# Patient Record
Sex: Female | Born: 1935 | Race: Black or African American | Hispanic: No | Smoking: Never smoker
Health system: Southern US, Community
[De-identification: ages and names within clinical notes are randomized; demographics above are authoritative.]

## PROBLEM LIST (undated history)

## (undated) DIAGNOSIS — Z8673 Personal history of transient ischemic attack (TIA), and cerebral infarction without residual deficits: Secondary | ICD-10-CM

## (undated) DIAGNOSIS — F0390 Unspecified dementia without behavioral disturbance: Secondary | ICD-10-CM

## (undated) DIAGNOSIS — E079 Disorder of thyroid, unspecified: Secondary | ICD-10-CM

## (undated) DIAGNOSIS — E7800 Pure hypercholesterolemia, unspecified: Secondary | ICD-10-CM

## (undated) DIAGNOSIS — M1990 Unspecified osteoarthritis, unspecified site: Secondary | ICD-10-CM

## (undated) DIAGNOSIS — I10 Essential (primary) hypertension: Secondary | ICD-10-CM

## (undated) DIAGNOSIS — D649 Anemia, unspecified: Secondary | ICD-10-CM

## (undated) DIAGNOSIS — R61 Generalized hyperhidrosis: Secondary | ICD-10-CM

---

## 2000-03-14 ENCOUNTER — Ambulatory Visit (HOSPITAL_COMMUNITY): Admission: RE | Admit: 2000-03-14 | Discharge: 2000-03-14 | Payer: Self-pay | Admitting: Ophthalmology

## 2000-10-17 ENCOUNTER — Ambulatory Visit (HOSPITAL_COMMUNITY): Admission: RE | Admit: 2000-10-17 | Discharge: 2000-10-17 | Payer: Self-pay | Admitting: Family Medicine

## 2000-10-17 ENCOUNTER — Encounter: Payer: Self-pay | Admitting: Family Medicine

## 2000-11-18 ENCOUNTER — Ambulatory Visit (HOSPITAL_COMMUNITY): Admission: RE | Admit: 2000-11-18 | Discharge: 2000-11-18 | Payer: Self-pay | Admitting: Ophthalmology

## 2002-09-30 ENCOUNTER — Encounter: Payer: Self-pay | Admitting: Internal Medicine

## 2002-09-30 ENCOUNTER — Encounter: Admission: RE | Admit: 2002-09-30 | Discharge: 2002-09-30 | Payer: Self-pay | Admitting: Internal Medicine

## 2007-05-27 ENCOUNTER — Emergency Department (HOSPITAL_COMMUNITY): Admission: EM | Admit: 2007-05-27 | Discharge: 2007-05-27 | Payer: Self-pay | Admitting: Emergency Medicine

## 2007-09-09 ENCOUNTER — Emergency Department (HOSPITAL_COMMUNITY): Admission: EM | Admit: 2007-09-09 | Discharge: 2007-09-09 | Payer: Self-pay | Admitting: Emergency Medicine

## 2008-10-06 ENCOUNTER — Inpatient Hospital Stay (HOSPITAL_COMMUNITY): Admission: AD | Admit: 2008-10-06 | Discharge: 2008-10-09 | Payer: Self-pay | Admitting: Internal Medicine

## 2008-10-06 ENCOUNTER — Ambulatory Visit: Payer: Self-pay | Admitting: Cardiology

## 2008-10-08 ENCOUNTER — Ambulatory Visit: Payer: Self-pay | Admitting: Vascular Surgery

## 2008-10-08 ENCOUNTER — Encounter (INDEPENDENT_AMBULATORY_CARE_PROVIDER_SITE_OTHER): Payer: Self-pay | Admitting: *Deleted

## 2009-08-04 ENCOUNTER — Emergency Department (HOSPITAL_COMMUNITY): Admission: EM | Admit: 2009-08-04 | Discharge: 2009-08-04 | Payer: Self-pay | Admitting: Emergency Medicine

## 2009-10-03 ENCOUNTER — Observation Stay (HOSPITAL_COMMUNITY): Admission: EM | Admit: 2009-10-03 | Discharge: 2009-10-04 | Payer: Self-pay | Admitting: Emergency Medicine

## 2009-10-04 ENCOUNTER — Ambulatory Visit: Payer: Self-pay | Admitting: Vascular Surgery

## 2009-10-04 ENCOUNTER — Encounter (INDEPENDENT_AMBULATORY_CARE_PROVIDER_SITE_OTHER): Payer: Self-pay | Admitting: Internal Medicine

## 2010-11-13 ENCOUNTER — Encounter: Payer: Self-pay | Admitting: Family Medicine

## 2010-12-19 ENCOUNTER — Emergency Department (HOSPITAL_COMMUNITY): Payer: Medicare Other

## 2010-12-19 ENCOUNTER — Emergency Department (HOSPITAL_COMMUNITY)
Admission: EM | Admit: 2010-12-19 | Discharge: 2010-12-19 | Disposition: A | Discharge status: Home / Self Care | Payer: Medicare Other | Attending: Emergency Medicine | Admitting: Emergency Medicine

## 2010-12-19 DIAGNOSIS — J40 Bronchitis, not specified as acute or chronic: Secondary | ICD-10-CM | POA: Insufficient documentation

## 2010-12-19 DIAGNOSIS — Z8673 Personal history of transient ischemic attack (TIA), and cerebral infarction without residual deficits: Secondary | ICD-10-CM | POA: Insufficient documentation

## 2010-12-19 DIAGNOSIS — E7800 Pure hypercholesterolemia, unspecified: Secondary | ICD-10-CM | POA: Insufficient documentation

## 2010-12-19 DIAGNOSIS — J3489 Other specified disorders of nose and nasal sinuses: Secondary | ICD-10-CM | POA: Insufficient documentation

## 2010-12-19 DIAGNOSIS — E039 Hypothyroidism, unspecified: Secondary | ICD-10-CM | POA: Insufficient documentation

## 2010-12-19 DIAGNOSIS — I10 Essential (primary) hypertension: Secondary | ICD-10-CM | POA: Insufficient documentation

## 2010-12-19 DIAGNOSIS — R059 Cough, unspecified: Secondary | ICD-10-CM | POA: Insufficient documentation

## 2010-12-19 DIAGNOSIS — R6883 Chills (without fever): Secondary | ICD-10-CM | POA: Insufficient documentation

## 2010-12-19 DIAGNOSIS — R05 Cough: Secondary | ICD-10-CM | POA: Insufficient documentation

## 2010-12-19 LAB — CBC
HCT: 34.4 % — ABNORMAL LOW (ref 36.0–46.0)
MCHC: 34.3 g/dL (ref 30.0–36.0)
Platelets: 94 10*3/uL — ABNORMAL LOW (ref 150–400)
RDW: 13 % (ref 11.5–15.5)
WBC: 5.6 10*3/uL (ref 4.0–10.5)

## 2010-12-19 LAB — BASIC METABOLIC PANEL
Calcium: 9.2 mg/dL (ref 8.4–10.5)
GFR calc non Af Amer: 57 mL/min — ABNORMAL LOW (ref >60)
Glucose, Bld: 115 mg/dL — ABNORMAL HIGH (ref 70–99)
Potassium: 3.8 mEq/L (ref 3.5–5.1)
Sodium: 140 mEq/L (ref 135–145)

## 2011-01-23 LAB — CBC
HCT: 27.3 % — ABNORMAL LOW (ref 36.0–46.0)
HCT: 32.4 % — ABNORMAL LOW (ref 36.0–46.0)
Hemoglobin: 11.1 g/dL — ABNORMAL LOW (ref 12.0–15.0)
Platelets: 112 10*3/uL — ABNORMAL LOW (ref 150–400)
Platelets: 113 10*3/uL — ABNORMAL LOW (ref 150–400)
Platelets: 119 10*3/uL — ABNORMAL LOW (ref 150–400)
RBC: 3.34 MIL/uL — ABNORMAL LOW (ref 3.87–5.11)
RDW: 13.7 % (ref 11.5–15.5)
RDW: 14.6 % (ref 11.5–15.5)
WBC: 6.7 10*3/uL (ref 4.0–10.5)
WBC: 6.8 10*3/uL (ref 4.0–10.5)

## 2011-01-23 LAB — POCT CARDIAC MARKERS
CKMB, poc: <1 ng/mL — ABNORMAL LOW (ref 1.0–8.0)
Myoglobin, poc: 67.3 ng/mL (ref 12–200)
Troponin i, poc: <0.05 ng/mL (ref 0.00–0.09)

## 2011-01-23 LAB — PHOSPHORUS
Phosphorus: 3.4 mg/dL (ref 2.3–4.6)
Phosphorus: 4.5 mg/dL (ref 2.3–4.6)

## 2011-01-23 LAB — URINALYSIS, MICROSCOPIC ONLY
Hgb urine dipstick: NEGATIVE
Nitrite: NEGATIVE
Specific Gravity, Urine: 1.009 (ref 1.005–1.030)
Urobilinogen, UA: 0.2 mg/dL (ref 0.0–1.0)

## 2011-01-23 LAB — COMPREHENSIVE METABOLIC PANEL
ALT: 11 U/L (ref 0–35)
Albumin: 3.1 g/dL — ABNORMAL LOW (ref 3.5–5.2)
Albumin: 3.8 g/dL (ref 3.5–5.2)
Alkaline Phosphatase: 62 U/L (ref 39–117)
Alkaline Phosphatase: 71 U/L (ref 39–117)
BUN: 10 mg/dL (ref 6–23)
Chloride: 90 mEq/L — ABNORMAL LOW (ref 96–112)
Glucose, Bld: 108 mg/dL — ABNORMAL HIGH (ref 70–99)
Potassium: 3.9 mEq/L (ref 3.5–5.1)
Potassium: 4 mEq/L (ref 3.5–5.1)
Sodium: 126 mEq/L — ABNORMAL LOW (ref 135–145)
Sodium: 131 mEq/L — ABNORMAL LOW (ref 135–145)
Total Bilirubin: 0.6 mg/dL (ref 0.3–1.2)
Total Protein: 6.2 g/dL (ref 6.0–8.3)
Total Protein: 7.8 g/dL (ref 6.0–8.3)

## 2011-01-23 LAB — LIPID PANEL: HDL: 76 mg/dL (ref >39)

## 2011-01-23 LAB — CK TOTAL AND CKMB (NOT AT ARMC)
CK, MB: 1.1 ng/mL (ref 0.3–4.0)
Relative Index: INVALID (ref 0.0–2.5)
Total CK: 61 U/L (ref 7–177)

## 2011-01-23 LAB — FOLATE RBC: RBC Folate: 1314 ng/mL — ABNORMAL HIGH (ref 180–600)

## 2011-01-23 LAB — CALCIUM / CREATININE RATIO, URINE: Calcium, Ur: 1 mg/dL

## 2011-01-23 LAB — DIFFERENTIAL
Basophils Absolute: 0 10*3/uL (ref 0.0–0.1)
Basophils Relative: 0 % (ref 0–1)
Eosinophils Absolute: 0 10*3/uL (ref 0.0–0.7)
Monocytes Relative: 7 % (ref 3–12)
Neutrophils Relative %: 72 % (ref 43–77)

## 2011-01-23 LAB — SEDIMENTATION RATE: Sed Rate: 9 mm/hr (ref 0–22)

## 2011-01-23 LAB — BRAIN NATRIURETIC PEPTIDE: Pro B Natriuretic peptide (BNP): <30 pg/mL (ref 0.0–100.0)

## 2011-01-23 LAB — CARDIAC PANEL(CRET KIN+CKTOT+MB+TROPI)
CK, MB: 1 ng/mL (ref 0.3–4.0)
CK, MB: 1 ng/mL (ref 0.3–4.0)
Relative Index: INVALID (ref 0.0–2.5)
Total CK: 46 U/L (ref 7–177)
Troponin I: 0.03 ng/mL (ref 0.00–0.06)

## 2011-01-23 LAB — MAGNESIUM: Magnesium: 2 mg/dL (ref 1.5–2.5)

## 2011-01-23 LAB — OSMOLALITY: Osmolality: 274 mOsm/kg — ABNORMAL LOW (ref 275–300)

## 2011-01-23 LAB — NA AND K (SODIUM & POTASSIUM), RAND UR
Potassium Urine: 12 mEq/L
Sodium, Ur: 32 mEq/L

## 2011-01-23 LAB — IRON AND TIBC: Iron: 36 ug/dL — ABNORMAL LOW (ref 42–135)

## 2011-01-23 LAB — T4, FREE: Free T4: 1.11 ng/dL (ref 0.80–1.80)

## 2011-01-23 LAB — TROPONIN I: Troponin I: 0.02 ng/mL (ref 0.00–0.06)

## 2011-01-23 LAB — T3, FREE: T3, Free: 2.9 pg/mL (ref 2.3–4.2)

## 2011-01-23 LAB — URIC ACID: Uric Acid, Serum: 5.1 mg/dL (ref 2.4–7.0)

## 2011-01-23 LAB — URINE CULTURE

## 2011-01-23 LAB — CALCIUM: Calcium: 8.6 mg/dL (ref 8.4–10.5)

## 2011-01-25 LAB — COMPREHENSIVE METABOLIC PANEL
Alkaline Phosphatase: 102 U/L (ref 39–117)
BUN: 11 mg/dL (ref 6–23)
CO2: 25 mEq/L (ref 19–32)
Chloride: 103 mEq/L (ref 96–112)
GFR calc non Af Amer: >60 mL/min (ref >60)
Glucose, Bld: 108 mg/dL — ABNORMAL HIGH (ref 70–99)
Potassium: 3.9 mEq/L (ref 3.5–5.1)
Total Bilirubin: 0.5 mg/dL (ref 0.3–1.2)

## 2011-01-25 LAB — CBC
HCT: 34.6 % — ABNORMAL LOW (ref 36.0–46.0)
Hemoglobin: 11.6 g/dL — ABNORMAL LOW (ref 12.0–15.0)
RDW: 13.8 % (ref 11.5–15.5)

## 2011-01-25 LAB — PROTIME-INR
INR: 1.01 (ref 0.00–1.49)
Prothrombin Time: 13.2 seconds (ref 11.6–15.2)

## 2011-01-25 LAB — DIFFERENTIAL
Basophils Absolute: 0.1 10*3/uL (ref 0.0–0.1)
Basophils Relative: 1 % (ref 0–1)
Neutro Abs: 3.8 10*3/uL (ref 1.7–7.7)
Neutrophils Relative %: 60 % (ref 43–77)

## 2011-02-23 ENCOUNTER — Emergency Department (HOSPITAL_COMMUNITY): Payer: Medicare Other

## 2011-02-23 ENCOUNTER — Emergency Department (HOSPITAL_COMMUNITY)
Admission: EM | Admit: 2011-02-23 | Discharge: 2011-02-23 | Disposition: A | Discharge status: Home / Self Care | Payer: Medicare Other | Attending: Emergency Medicine | Admitting: Emergency Medicine

## 2011-02-23 DIAGNOSIS — R55 Syncope and collapse: Secondary | ICD-10-CM | POA: Insufficient documentation

## 2011-02-23 DIAGNOSIS — R5383 Other fatigue: Secondary | ICD-10-CM | POA: Insufficient documentation

## 2011-02-23 DIAGNOSIS — R5381 Other malaise: Secondary | ICD-10-CM | POA: Insufficient documentation

## 2011-02-23 DIAGNOSIS — R918 Other nonspecific abnormal finding of lung field: Secondary | ICD-10-CM | POA: Insufficient documentation

## 2011-02-23 DIAGNOSIS — E871 Hypo-osmolality and hyponatremia: Secondary | ICD-10-CM | POA: Insufficient documentation

## 2011-02-23 DIAGNOSIS — E860 Dehydration: Secondary | ICD-10-CM | POA: Insufficient documentation

## 2011-02-23 DIAGNOSIS — Z8673 Personal history of transient ischemic attack (TIA), and cerebral infarction without residual deficits: Secondary | ICD-10-CM | POA: Insufficient documentation

## 2011-02-23 DIAGNOSIS — E039 Hypothyroidism, unspecified: Secondary | ICD-10-CM | POA: Insufficient documentation

## 2011-02-23 DIAGNOSIS — I10 Essential (primary) hypertension: Secondary | ICD-10-CM | POA: Insufficient documentation

## 2011-02-23 DIAGNOSIS — R634 Abnormal weight loss: Secondary | ICD-10-CM | POA: Insufficient documentation

## 2011-02-23 DIAGNOSIS — R197 Diarrhea, unspecified: Secondary | ICD-10-CM | POA: Insufficient documentation

## 2011-02-23 DIAGNOSIS — G319 Degenerative disease of nervous system, unspecified: Secondary | ICD-10-CM | POA: Insufficient documentation

## 2011-02-23 DIAGNOSIS — E7800 Pure hypercholesterolemia, unspecified: Secondary | ICD-10-CM | POA: Insufficient documentation

## 2011-02-23 LAB — URINALYSIS, ROUTINE W REFLEX MICROSCOPIC
Bilirubin Urine: NEGATIVE
Glucose, UA: NEGATIVE mg/dL
Ketones, ur: NEGATIVE mg/dL
Nitrite: NEGATIVE
pH: 6 (ref 5.0–8.0)

## 2011-02-23 LAB — POCT CARDIAC MARKERS
CKMB, poc: <1 ng/mL — ABNORMAL LOW (ref 1.0–8.0)
Myoglobin, poc: 76.9 ng/mL (ref 12–200)
Troponin i, poc: <0.05 ng/mL (ref 0.00–0.09)
Troponin i, poc: <0.05 ng/mL (ref 0.00–0.09)

## 2011-02-23 LAB — CBC
MCH: 31.3 pg (ref 26.0–34.0)
Platelets: 118 10*3/uL — ABNORMAL LOW (ref 150–400)
RBC: 3.29 MIL/uL — ABNORMAL LOW (ref 3.87–5.11)

## 2011-02-23 LAB — DIFFERENTIAL
Basophils Absolute: 0 10*3/uL (ref 0.0–0.1)
Basophils Relative: 0 % (ref 0–1)
Eosinophils Absolute: 0.1 10*3/uL (ref 0.0–0.7)
Monocytes Relative: 10 % (ref 3–12)
Neutrophils Relative %: 55 % (ref 43–77)

## 2011-02-23 LAB — POCT I-STAT, CHEM 8
BUN: 21 mg/dL (ref 6–23)
Calcium, Ion: 1.12 mmol/L (ref 1.12–1.32)
Chloride: 96 mEq/L (ref 96–112)
Glucose, Bld: 139 mg/dL — ABNORMAL HIGH (ref 70–99)

## 2011-03-06 NOTE — Discharge Summary (Signed)
Summer [name], Summer [name]           ACCOUNT NO.:  [id number]   MEDICAL RECORD NO.:  [id number]          PATIENT TYPE:  INP   LOCATION:  1428                         FACILITY:  [facility]   PHYSICIAN:  Lucita Ferrara, MD         DATE OF BIRTH:  05-Dec-1918   DATE OF ADMISSION:  10/06/2008  DATE OF DISCHARGE:  10/07/2008                               DISCHARGE SUMMARY   DISCHARGE DIAGNOSES:  1. Transient ischemic attack.  2. Small acute nonhemorrhagic left-sided thalamic infarct.  3. Moderate-marked small vessel disease type changes.  4. Degenerative joint disease.  5. Cervical spondylitic changes and cord compression, most notable in      C4-C5 followed by C5-C6.  Within compression cord, there may be      gliosis and edema.   Dictation ends here.      Lucita Ferrara, MD  Electronically Signed     RR/MEDQ  D:  10/08/2008  T:  10/08/2008  Job:  [id number]

## 2011-03-06 NOTE — H&P (Signed)
Summer [name], Summer [name]           ACCOUNT NO.:  [id number]   MEDICAL RECORD NO.:  [id number]          PATIENT TYPE:  INP   LOCATION:  1428                         FACILITY:  [facility]   PHYSICIAN:  Della Goo, M.D. DATE OF BIRTH:  1919-07-23   DATE OF ADMISSION:  10/06/2008  DATE OF DISCHARGE:                              HISTORY & PHYSICAL   PRIMARY CARE PHYSICIAN:  Dr. Della Goo   CHIEF COMPLAINT:  Sudden numbness in jaw and left arm for 24 hours.   HISTORY OF PRESENT ILLNESS:  This is an 77 year old female who was seen  in my office for evaluation secondary to complaints of numbness in her  jaw and her left arm that she reports started 1 day ago.  She states  that it is a tingling sensation..  She denies having any trauma to the  area.  She denies having any chest pain, shortness of breath.  The  patient was seen in the office and found to have an elevated blood  pressure of 202/92.  She has had issues of elevated blood pressures in  the past and was placed on medication in the past as well and did not  tolerate medication with frequent episodes of hypotension.  The patient  currently denies having any headache, fevers, chills, congestion, chest  pain, shortness of breath, nausea, vomiting, diarrhea, changes in her  bowel or bladder habits.  She reports being able to ambulate without  difficulty.  Denies having any dizziness, lightheadedness or presyncope  symptoms.   PAST MEDICAL HISTORY:  Significant for labile blood pressures and  arthritis.   PAST SURGICAL HISTORY:  History of cataract surgeries of both eyes.   MEDICATIONS:  None other than over-the-counter medications.   ALLERGIES:  NO KNOWN DRUG ALLERGIES.   SOCIAL HISTORY:  The patient lives with her daughter.  She is a  nonsmoker, and she rarely has 1 glass of wine.   FAMILY HISTORY:  Positive for son and daughter with hypertension.  No  history of coronary artery disease, diabetes or cancer in her  family  that she knows of.   REVIEW OF SYSTEMS:  Pertinents are mentioned above.   PHYSICAL EXAMINATION FINDINGS:  GENERAL:  This is a thin 77 year old  female currently in discomfort but in no acute distress.  VITAL SIGNS:  Temperature 98.4, blood pressure 202/92, heart rate 76,  respirations 24.  HEENT:  Normocephalic, atraumatic.  Pupils equally round, reactive to  light.  There is no scleral icterus.  Extraocular movements are intact;  funduscopic benign.  Oropharynx is clear.  There was no facial drooping.  There is no tongue deviation.  NECK:  Supple with full range of motion.  No thyromegaly, adenopathy,  jugular venous distention.   CARDIOVASCULAR:  Regular rate and rhythm.  No murmurs, gallops or rubs.  LUNGS:  Clear to auscultation bilaterally.  ABDOMEN:  Positive bowel sounds, soft, nontender, nondistended.  EXTREMITIES:  Without cyanosis, clubbing or edema.  NEUROLOGIC EXAMINATION:  There are no focal deficits.  The patient is  alert and oriented x3.  Speech is clear.   ASSESSMENT:  The patient is an  77 year old female being referred for  direct admission for further evaluation of the following:  1. Left arm numbness.  2. Transient ischemic attack versus cerebrovascular accident.  3. Elevated blood pressure/uncontrolled hypertension.  4. Arthritis.   PLAN:  The patient will be admitted to telemetry area, and cardiac  enzymes will be started.  The patient will also be placed on neurologic  checks.  The patient will also be sent for an MRI study of the brain and  of the C-spine..  Clonidine has been ordered p.r.n. systolic blood  pressures greater than 160.  The patient will also be placed on DVT and  GI prophylaxis for now, and further therapy will ensue pending results  of her studies.      Della Goo, M.D.  Electronically Signed     HJ/MEDQ  D:  10/06/2008  T:  10/06/2008  Job:  [id number]   cc:   Della Goo, M.D.  Fax: [phone]

## 2011-03-06 NOTE — Discharge Summary (Signed)
NAMEMARCEE, [name]           ACCOUNT NO.:  [id number]   MEDICAL RECORD NO.:  [id number]          PATIENT TYPE:  INP   LOCATION:  1428                         [facility]  [facility]   PHYSICIAN:  Altha Harm, MDDATE OF BIRTH:  14-Aug-1919   DATE OF ADMISSION:  10/06/2008  DATE OF DISCHARGE:  10/09/2008                               DISCHARGE SUMMARY   DISPOSITION:  Home.   DISCHARGE DIAGNOSES:  1. Acute left thalamic cerebrovascular accident.  2. Labile hypertension, now controlled.  3. C4-C5 cord compression.  4. Hypothyroidism, new diagnosis.   DISCHARGE MEDICATIONS:  1. Enteric-coated aspirin 325 mg p.o. daily.  2. Zocor 20 mg p.o. daily.  3. Synthroid 25 mcg p.o. daily.  4. Prilosec 20 mg p.o. daily.   CONSULTATIONS:  Phone consultation with neurosurgery, Dr. Phoebe Perch.   PROCEDURES:  None.   DIAGNOSTIC STUDIES:  1. MRI of the brain without contrast that shows a small, acute, non-      hemorrhagic, left thalamic infarct.  Tiny areas of blood breakdown      products right parietal lobe, most suggestive of prior hemorrhagic      ischemia.  Otherwise no intracranial hemorrhage.  2. MRI of the C-spine without contrast which shows cervical      spondylytic changes and cord compression most notable at C4 to C5      level followed by C5 to C6 level.  Within the cord compression,      there may be gliosis or edema.  3. A 2-D echocardiogram done on December 18 which showed overall left      ventricular systolic function normal, with an ejection fraction of      55%. No regional left ventricular wall motion abnormalities,.  Left      ventricular wall thickness mildly decreased.  Aortic valve      thickness mildly increased.  Mild mitral valvular regurgitation.  4. Bilateral carotid duplex which shows no significant plaque      visualized.  No ICA stenosis.   CHIEF COMPLAINT:  Sudden numbness in the jaw and left arm for 24 hours.   HISTORY OF PRESENT ILLNESS:  Please refer  to the H&P dictated by Dr.  Della Goo for details of the HPI.   HOSPITAL COURSE:  The patient was admitted to the hospital with the  complaint of numbness.  Diagnostic studies were done in the form on an  MRI of the neck and head which showed findings as noted above.  The  patient received further testing for risk stratification including a 2-D  echocardiogram and carotid duplex with findings as noted above.  The  patient also had an RPR nonreactive check which was negative.  A TSH was  checked and was found to be elevated with a TSH level of 6.497.  Please  note that a T3 and free T4 level had been ordered and results are  pending.  I have asked the primary care physician, Dr. Della Goo,  to follow up on the results of this.  The patient also had a markedly  elevated blood pressure upon arrival to the hospital.  Apparently  this  patient has had labile blood pressures with periods of profound  hypotension.  Therefore, primary care physician has made the decision  not to put her on any chronic antihypertensive medication.  Throughout  her hospital stay, after the initial hypertensive readings, her blood  pressures have been controlled in the 130s.  The patient is being sent  home without any chronic blood pressure medications and I will defer to  her primary care physician in the office to make a decision about  chronic antihypertensive agents.  In terms of risk reduction, the  patient has been placed on an aspirin as well as statin for secondary  CVA risk reduction.  The patient is ambulatory in the hospital.  She has  not received a formal PT/OT evaluation.  However, the patient is  ambulatory without any difficulty and I will have physical therapy and  occupational therapy see the patient at home for home evaluation for any  modifications that may be required.  On CT scan of the neck the patient  was found to have spondylosis with some cervical cord compression from  C4  to C6.  This was discussed with the neurosurgeon who recommended that  the patient follow up as an outpatient as they did not feel surgical  intervention at this time was necessary.  The cord compression does not  appear to be causing any symptoms in this patient at this time.   CONDITION ON DISCHARGE:  Stable.  Her temperature is 98.8.  Heart rate  66, blood pressure 130/79, respiratory rate 20, oxygen saturation 100%  on room air.  Subsequent auscultation, no wheezing or rhonchi noted.  She has no murmurs, rubs or gallops noted on cardiovascular examination.  Abdominal examination showed abdomen soft, obese, nontender,  nondistended.  No hepatosplenomegaly.  There is no clubbing, cyanosis or  edema in the extremities.  The patient is tolerating her diet without  any difficulty.   FOLLOW UP:  The patient is to follow up with Dr. Della Goo in her  office in one week for further evaluation for blood pressure.  She needs  to have T3 and free T4 followed up by Dr. Lovell Sheehan also and a repeat of  her TSH done in approximately six weeks for the titration of her  medications.  In terms of the cord compression, the patient will follow  up with Dr. Leveda Anna in the office.  A phone number has been provided and  the patient is to call to make a followup appointment.   DIET:  The patient should be on a low sodium diet. Please note that the  patient's cholesterol was not checked and the patient should have a  fasting cholesterol profile done as an outpatient for further risk  reduction strategies.  I will defer that to her primary care physician  as cholesterol reduction will not make an immediate risk reduction in  this patient but will effect her over time; thus it is my recommendation  that at the earliest opportunity the patient's cholesterol be evaluated  and monitored as necessary.      Altha Harm, MD  Electronically Signed     MAM/MEDQ  D:  10/09/2008  T:  10/09/2008   Job:  [id number]   cc:   Della Goo, M.D.  Fax: [phone]   Santiago Bumpers. Leveda Anna, M.D.

## 2011-07-27 LAB — PROTIME-INR
INR: 1.1 (ref 0.00–1.49)
Prothrombin Time: 14.3 seconds (ref 11.6–15.2)

## 2011-07-27 LAB — COMPREHENSIVE METABOLIC PANEL
ALT: 10 U/L (ref 0–35)
Alkaline Phosphatase: 80 U/L (ref 39–117)
CO2: 28 mEq/L (ref 19–32)
Chloride: 103 mEq/L (ref 96–112)
GFR calc non Af Amer: >60 mL/min (ref >60)
Glucose, Bld: 102 mg/dL — ABNORMAL HIGH (ref 70–99)
Potassium: 3.6 mEq/L (ref 3.5–5.1)
Sodium: 137 mEq/L (ref 135–145)

## 2011-07-27 LAB — CARDIAC PANEL(CRET KIN+CKTOT+MB+TROPI)
CK, MB: 1.2 ng/mL (ref 0.3–4.0)
Relative Index: INVALID (ref 0.0–2.5)
Total CK: 58 U/L (ref 7–177)
Total CK: 59 U/L (ref 7–177)
Total CK: 65 U/L (ref 7–177)
Troponin I: <0.01 ng/mL (ref 0.00–0.06)

## 2011-07-27 LAB — CBC
Hemoglobin: 12.2 g/dL (ref 12.0–15.0)
RBC: 3.94 MIL/uL (ref 3.87–5.11)

## 2011-07-27 LAB — FOLATE RBC: RBC Folate: 829 ng/mL — ABNORMAL HIGH (ref 180–600)

## 2011-07-27 LAB — DIFFERENTIAL
Basophils Relative: 1 % (ref 0–1)
Eosinophils Absolute: 0.1 10*3/uL (ref 0.0–0.7)
Monocytes Relative: 10 % (ref 3–12)
Neutrophils Relative %: 44 % (ref 43–77)

## 2011-07-31 LAB — URINALYSIS, ROUTINE W REFLEX MICROSCOPIC
Glucose, UA: NEGATIVE
Ketones, ur: NEGATIVE
Specific Gravity, Urine: 1.015
pH: 7.5

## 2011-09-25 ENCOUNTER — Emergency Department (HOSPITAL_COMMUNITY): Payer: Medicare Other

## 2011-09-25 ENCOUNTER — Encounter (HOSPITAL_COMMUNITY)
Admission: EM | Disposition: A | Discharge status: Home Health | Payer: Self-pay | Source: Home / Self Care | Attending: Internal Medicine

## 2011-09-25 ENCOUNTER — Emergency Department (HOSPITAL_COMMUNITY): Payer: Medicare Other | Admitting: Anesthesiology

## 2011-09-25 ENCOUNTER — Inpatient Hospital Stay (HOSPITAL_COMMUNITY)
Admission: EM | Admit: 2011-09-25 | Discharge: 2011-10-03 | DRG: 470 | Disposition: A | Discharge status: Home Health | Payer: Medicare Other | Attending: Internal Medicine | Admitting: Internal Medicine

## 2011-09-25 ENCOUNTER — Encounter (HOSPITAL_COMMUNITY): Payer: Self-pay | Admitting: Anesthesiology

## 2011-09-25 ENCOUNTER — Encounter: Payer: Self-pay | Admitting: Emergency Medicine

## 2011-09-25 ENCOUNTER — Other Ambulatory Visit: Payer: Self-pay

## 2011-09-25 DIAGNOSIS — E7800 Pure hypercholesterolemia, unspecified: Secondary | ICD-10-CM | POA: Diagnosis present

## 2011-09-25 DIAGNOSIS — I10 Essential (primary) hypertension: Secondary | ICD-10-CM | POA: Diagnosis present

## 2011-09-25 DIAGNOSIS — S72001A Fracture of unspecified part of neck of right femur, initial encounter for closed fracture: Secondary | ICD-10-CM

## 2011-09-25 DIAGNOSIS — F0390 Unspecified dementia without behavioral disturbance: Secondary | ICD-10-CM | POA: Diagnosis present

## 2011-09-25 DIAGNOSIS — S72009A Fracture of unspecified part of neck of unspecified femur, initial encounter for closed fracture: Principal | ICD-10-CM | POA: Diagnosis present

## 2011-09-25 DIAGNOSIS — D62 Acute posthemorrhagic anemia: Secondary | ICD-10-CM | POA: Diagnosis not present

## 2011-09-25 DIAGNOSIS — Z8673 Personal history of transient ischemic attack (TIA), and cerebral infarction without residual deficits: Secondary | ICD-10-CM

## 2011-09-25 DIAGNOSIS — Y92009 Unspecified place in unspecified non-institutional (private) residence as the place of occurrence of the external cause: Secondary | ICD-10-CM

## 2011-09-25 DIAGNOSIS — W010XXA Fall on same level from slipping, tripping and stumbling without subsequent striking against object, initial encounter: Secondary | ICD-10-CM | POA: Diagnosis present

## 2011-09-25 HISTORY — DX: Personal history of transient ischemic attack (TIA), and cerebral infarction without residual deficits: Z86.73

## 2011-09-25 HISTORY — DX: Unspecified osteoarthritis, unspecified site: M19.90

## 2011-09-25 HISTORY — DX: Essential (primary) hypertension: I10

## 2011-09-25 HISTORY — DX: Pure hypercholesterolemia, unspecified: E78.00

## 2011-09-25 HISTORY — PX: HIP ARTHROPLASTY: SHX981

## 2011-09-25 HISTORY — DX: Disorder of thyroid, unspecified: E07.9

## 2011-09-25 LAB — URINALYSIS, ROUTINE W REFLEX MICROSCOPIC
Bilirubin Urine: NEGATIVE
Glucose, UA: NEGATIVE mg/dL
Hgb urine dipstick: NEGATIVE
Ketones, ur: NEGATIVE mg/dL
Protein, ur: NEGATIVE mg/dL
Urobilinogen, UA: 0.2 mg/dL (ref 0.0–1.0)

## 2011-09-25 LAB — BASIC METABOLIC PANEL
BUN: 9 mg/dL (ref 6–23)
Calcium: 9 mg/dL (ref 8.4–10.5)
GFR calc Af Amer: 72 mL/min — ABNORMAL LOW (ref >90)
GFR calc non Af Amer: 62 mL/min — ABNORMAL LOW (ref >90)
Potassium: 3.5 mEq/L (ref 3.5–5.1)

## 2011-09-25 LAB — CBC
Hemoglobin: 11.7 g/dL — ABNORMAL LOW (ref 12.0–15.0)
MCHC: 34.1 g/dL (ref 30.0–36.0)
Platelets: 99 10*3/uL — ABNORMAL LOW (ref 150–400)
RDW: 13.1 % (ref 11.5–15.5)

## 2011-09-25 LAB — PROTIME-INR
INR: 1.06 (ref 0.00–1.49)
Prothrombin Time: 14 seconds (ref 11.6–15.2)

## 2011-09-25 SURGERY — HEMIARTHROPLASTY, HIP, DIRECT ANTERIOR APPROACH, FOR FRACTURE
Anesthesia: General | Site: Hip | Laterality: Right | Wound class: Clean

## 2011-09-25 MED ORDER — GLYCOPYRROLATE 0.2 MG/ML IJ SOLN
INTRAMUSCULAR | Status: DC | PRN
  Administered 2011-09-25: .4 mg via INTRAVENOUS

## 2011-09-25 MED ORDER — LACTATED RINGERS IV SOLN
INTRAVENOUS | Status: AC
  Administered 2011-09-25: 1000 mL via INTRAVENOUS
  Administered 2011-09-25: 19:00:00 via INTRAVENOUS

## 2011-09-25 MED ORDER — CEFAZOLIN SODIUM 1-5 GM-% IV SOLN
INTRAVENOUS | Status: DC | PRN
  Administered 2011-09-25: 1 g via INTRAVENOUS

## 2011-09-25 MED ORDER — NEOSTIGMINE METHYLSULFATE 1 MG/ML IJ SOLN
INTRAMUSCULAR | Status: DC | PRN
  Administered 2011-09-25: 3 mg via INTRAVENOUS

## 2011-09-25 MED ORDER — LACTATED RINGERS IV SOLN
INTRAVENOUS | Status: DC
  Administered 2011-09-26: 1000 mL via INTRAVENOUS

## 2011-09-25 MED ORDER — METHOCARBAMOL 100 MG/ML IJ SOLN
500.0000 mg | Freq: Four times a day (QID) | INTRAVENOUS | Status: DC | PRN
  Filled 2011-09-25: qty 5

## 2011-09-25 MED ORDER — LIDOCAINE HCL (CARDIAC) 20 MG/ML IV SOLN
INTRAVENOUS | Status: DC | PRN
  Administered 2011-09-25: 20 mg via INTRAVENOUS

## 2011-09-25 MED ORDER — METOCLOPRAMIDE HCL 5 MG/ML IJ SOLN
10.0000 mg | Freq: Once | INTRAMUSCULAR | Status: AC | PRN
  Filled 2011-09-25: qty 2

## 2011-09-25 MED ORDER — METHOCARBAMOL 500 MG PO TABS
500.0000 mg | ORAL_TABLET | Freq: Four times a day (QID) | ORAL | Status: DC | PRN
  Administered 2011-09-26 – 2011-09-27 (×2): 500 mg via ORAL
  Filled 2011-09-25 (×2): qty 1

## 2011-09-25 MED ORDER — PROPOFOL 10 MG/ML IV EMUL
INTRAVENOUS | Status: DC | PRN
  Administered 2011-09-25: 100 mg via INTRAVENOUS

## 2011-09-25 MED ORDER — WARFARIN VIDEO
Freq: Once | Status: AC
  Administered 2011-09-26: 12:00:00

## 2011-09-25 MED ORDER — ONDANSETRON HCL 4 MG PO TABS: 4.0000 mg | ORAL_TABLET | Freq: Four times a day (QID) | ORAL | Status: DC | PRN

## 2011-09-25 MED ORDER — ACETAMINOPHEN 325 MG PO TABS: 650.0000 mg | ORAL_TABLET | Freq: Four times a day (QID) | ORAL | Status: DC | PRN

## 2011-09-25 MED ORDER — HYDROCODONE-ACETAMINOPHEN 5-325 MG PO TABS
1.0000 | ORAL_TABLET | ORAL | Status: DC | PRN
  Administered 2011-09-26: 2 via ORAL
  Administered 2011-09-26 – 2011-10-03 (×8): 1 via ORAL
  Filled 2011-09-25 (×5): qty 1
  Filled 2011-09-25: qty 2
  Filled 2011-09-25 (×3): qty 1

## 2011-09-25 MED ORDER — SUCCINYLCHOLINE CHLORIDE 20 MG/ML IJ SOLN
INTRAMUSCULAR | Status: DC | PRN
  Administered 2011-09-25: 80 mg via INTRAVENOUS

## 2011-09-25 MED ORDER — FENTANYL CITRATE 0.05 MG/ML IJ SOLN
50.0000 ug | Freq: Once | INTRAMUSCULAR | Status: AC
  Administered 2011-09-25: 50 ug via INTRAVENOUS
  Filled 2011-09-25: qty 2

## 2011-09-25 MED ORDER — SODIUM CHLORIDE 0.9 % IV SOLN: INTRAVENOUS | Status: AC

## 2011-09-25 MED ORDER — SODIUM CHLORIDE 0.9 % IR SOLN
Status: DC | PRN
  Administered 2011-09-25: 1000 mL

## 2011-09-25 MED ORDER — HYDROMORPHONE HCL PF 1 MG/ML IJ SOLN
0.5000 mg | INTRAMUSCULAR | Status: DC | PRN
  Administered 2011-09-25: 1 mg via INTRAVENOUS
  Administered 2011-09-26: 0.5 mg via INTRAVENOUS
  Administered 2011-09-26 (×3): 1 mg via INTRAVENOUS
  Administered 2011-09-27: 0.5 mg via INTRAVENOUS
  Administered 2011-09-27: 1 mg via INTRAVENOUS
  Administered 2011-09-27 – 2011-09-28 (×2): 0.5 mg via INTRAVENOUS
  Administered 2011-09-28: 1 mg via INTRAVENOUS
  Administered 2011-09-28: 0.5 mg via INTRAVENOUS
  Administered 2011-09-28 (×2): 1 mg via INTRAVENOUS
  Administered 2011-09-29: 0.5 mg via INTRAVENOUS
  Administered 2011-09-29: 1 mg via INTRAVENOUS
  Administered 2011-09-29: 0.5 mg via INTRAVENOUS
  Administered 2011-09-29 – 2011-09-30 (×4): 1 mg via INTRAVENOUS
  Administered 2011-09-30 – 2011-10-01 (×3): 0.5 mg via INTRAVENOUS
  Administered 2011-10-01 (×2): 1 mg via INTRAVENOUS
  Filled 2011-09-25 (×26): qty 1

## 2011-09-25 MED ORDER — WARFARIN SODIUM 4 MG PO TABS
4.0000 mg | ORAL_TABLET | Freq: Once | ORAL | Status: AC
  Administered 2011-09-26: 4 mg via ORAL
  Filled 2011-09-25 (×2): qty 1

## 2011-09-25 MED ORDER — FENTANYL CITRATE 0.05 MG/ML IJ SOLN
25.0000 ug | INTRAMUSCULAR | Status: DC | PRN
  Administered 2011-09-25 (×3): 25 ug via INTRAVENOUS

## 2011-09-25 MED ORDER — FENTANYL CITRATE 0.05 MG/ML IJ SOLN
50.0000 ug | INTRAMUSCULAR | Status: DC | PRN
  Administered 2011-09-25 (×2): 50 ug via INTRAVENOUS
  Filled 2011-09-25 (×2): qty 2

## 2011-09-25 MED ORDER — PATIENT'S GUIDE TO USING COUMADIN BOOK
Freq: Once | Status: AC
  Administered 2011-09-26: 1
  Filled 2011-09-25: qty 1

## 2011-09-25 MED ORDER — ONDANSETRON HCL 4 MG/2ML IJ SOLN
INTRAMUSCULAR | Status: DC | PRN
  Administered 2011-09-25 (×2): 2 mg via INTRAVENOUS

## 2011-09-25 MED ORDER — PHENOL 1.4 % MT LIQD
1.0000 | OROMUCOSAL | Status: DC | PRN
  Filled 2011-09-25: qty 177

## 2011-09-25 MED ORDER — CISATRACURIUM BESYLATE 2 MG/ML IV SOLN
INTRAVENOUS | Status: DC | PRN
  Administered 2011-09-25: 6 mg via INTRAVENOUS

## 2011-09-25 MED ORDER — CEFAZOLIN SODIUM 1-5 GM-% IV SOLN
1.0000 g | Freq: Three times a day (TID) | INTRAVENOUS | Status: AC
  Administered 2011-09-26 (×3): 1 g via INTRAVENOUS
  Filled 2011-09-25 (×4): qty 50

## 2011-09-25 MED ORDER — FENTANYL CITRATE 0.05 MG/ML IJ SOLN
INTRAMUSCULAR | Status: AC
  Filled 2011-09-25: qty 2

## 2011-09-25 MED ORDER — CEFAZOLIN SODIUM 1-5 GM-% IV SOLN
INTRAVENOUS | Status: AC
  Filled 2011-09-25: qty 50

## 2011-09-25 MED ORDER — ONDANSETRON HCL 4 MG/2ML IJ SOLN: 4.0000 mg | Freq: Four times a day (QID) | INTRAMUSCULAR | Status: DC | PRN

## 2011-09-25 MED ORDER — METOCLOPRAMIDE HCL 5 MG/ML IJ SOLN
5.0000 mg | Freq: Three times a day (TID) | INTRAMUSCULAR | Status: DC | PRN
  Filled 2011-09-25: qty 2

## 2011-09-25 MED ORDER — MENTHOL 3 MG MT LOZG
1.0000 | LOZENGE | OROMUCOSAL | Status: DC | PRN
  Filled 2011-09-25: qty 9

## 2011-09-25 MED ORDER — METOCLOPRAMIDE HCL 10 MG PO TABS: 5.0000 mg | ORAL_TABLET | Freq: Three times a day (TID) | ORAL | Status: DC | PRN

## 2011-09-25 MED ORDER — ENOXAPARIN SODIUM 30 MG/0.3ML ~~LOC~~ SOLN
30.0000 mg | Freq: Two times a day (BID) | SUBCUTANEOUS | Status: DC
  Administered 2011-09-26 – 2011-10-01 (×11): 30 mg via SUBCUTANEOUS
  Filled 2011-09-25 (×20): qty 0.3

## 2011-09-25 MED ORDER — SODIUM CHLORIDE 0.9 % IV BOLUS (SEPSIS)
500.0000 mL | INTRAVENOUS | Status: AC
  Administered 2011-09-25: 500 mL via INTRAVENOUS

## 2011-09-25 MED ORDER — FENTANYL CITRATE 0.05 MG/ML IJ SOLN
INTRAMUSCULAR | Status: DC | PRN
  Administered 2011-09-25 (×4): 50 ug via INTRAVENOUS

## 2011-09-25 MED ORDER — ACETAMINOPHEN 650 MG RE SUPP: 650.0000 mg | Freq: Four times a day (QID) | RECTAL | Status: DC | PRN

## 2011-09-25 SURGICAL SUPPLY — 46 items
BAG ZIPLOCK 12X15 (MISCELLANEOUS) ×2 IMPLANT
BLADE SAW SAG 73X25 THK (BLADE) ×1
BLADE SAW SGTL 73X25 THK (BLADE) ×1 IMPLANT
BRUSH FEMORAL CANAL (MISCELLANEOUS) ×2 IMPLANT
CANISTER SUCTION 2500CC (MISCELLANEOUS) ×4 IMPLANT
CEMENT BONE DEPUY (Cement) ×4 IMPLANT
CEMENT RESTRICTOR DEPUY SZ 3 (Cement) ×2 IMPLANT
CLOTH BEACON ORANGE TIMEOUT ST (SAFETY) ×2 IMPLANT
COVER SURGICAL LIGHT HANDLE (MISCELLANEOUS) ×2 IMPLANT
DRAPE INCISE IOBAN 66X45 STRL (DRAPES) ×2 IMPLANT
DRAPE ORTHO SPLIT 77X108 STRL (DRAPES) ×2
DRAPE POUCH INSTRU U-SHP 10X18 (DRAPES) ×2 IMPLANT
DRAPE SURG ORHT 6 SPLT 77X108 (DRAPES) ×2 IMPLANT
DRAPE U-SHAPE 47X51 STRL (DRAPES) ×2 IMPLANT
DRAPE WARM FLUID 44X44 (DRAPE) ×2 IMPLANT
DRSG EMULSION OIL 3X16 NADH (GAUZE/BANDAGES/DRESSINGS) ×2 IMPLANT
DRSG MEPILEX BORDER 4X8 (GAUZE/BANDAGES/DRESSINGS) ×2 IMPLANT
DRSG PAD ABDOMINAL 8X10 ST (GAUZE/BANDAGES/DRESSINGS) ×4 IMPLANT
ELECT REM PT RETURN 9FT ADLT (ELECTROSURGICAL) ×2
ELECTRODE REM PT RTRN 9FT ADLT (ELECTROSURGICAL) ×1 IMPLANT
EVACUATOR 1/8 PVC DRAIN (DRAIN) ×2 IMPLANT
FACESHIELD LNG OPTICON STERILE (SAFETY) ×6 IMPLANT
GLOVE BIO SURGEON STRL SZ8.5 (GLOVE) ×2 IMPLANT
HANDPIECE INTERPULSE COAX TIP (DISPOSABLE) ×1
HOOD PEEL AWAY FACE SHEILD DIS (HOOD) ×4 IMPLANT
IMMOBILIZER KNEE 20 (SOFTGOODS)
IMMOBILIZER KNEE 20 THIGH 36 (SOFTGOODS) IMPLANT
KIT BASIN OR (CUSTOM PROCEDURE TRAY) ×2 IMPLANT
NEEDLE MA TROC 1/2 (NEEDLE) ×2 IMPLANT
PACK TOTAL JOINT (CUSTOM PROCEDURE TRAY) ×2 IMPLANT
PASSER SUT SWANSON 36MM LOOP (INSTRUMENTS) ×2 IMPLANT
POSITIONER SURGICAL ARM (MISCELLANEOUS) ×2 IMPLANT
PRESSURIZER FEMORAL UNIV (MISCELLANEOUS) ×2 IMPLANT
SET HNDPC FAN SPRY TIP SCT (DISPOSABLE) ×1 IMPLANT
SPONGE GAUZE 4X4 12PLY (GAUZE/BANDAGES/DRESSINGS) ×4 IMPLANT
STAPLER VISISTAT 35W (STAPLE) ×2 IMPLANT
STRIP CLOSURE SKIN 1/2X4 (GAUZE/BANDAGES/DRESSINGS) ×4 IMPLANT
SUT ETHIBOND NAB CT1 #1 30IN (SUTURE) ×8 IMPLANT
SUT MNCRL AB 4-0 PS2 18 (SUTURE) ×2 IMPLANT
SUT VIC AB 1 CT1 27 (SUTURE) ×3
SUT VIC AB 1 CT1 27XBRD ANTBC (SUTURE) ×3 IMPLANT
SUT VIC AB 2-0 CT1 27 (SUTURE) ×3
SUT VIC AB 2-0 CT1 TAPERPNT 27 (SUTURE) ×3 IMPLANT
TOWEL OR 17X26 10 PK STRL BLUE (TOWEL DISPOSABLE) ×2 IMPLANT
TOWER CARTRIDGE SMART MIX (DISPOSABLE) ×2 IMPLANT
TRAY FOLEY CATH 14FRSI W/METER (CATHETERS) ×2 IMPLANT

## 2011-09-25 NOTE — ED Notes (Signed)
Per EMS pt fell getting out of bed to go to the restroom   Pt landed on her right hip  Pt has shortening and outward rotation  PMS intact  Right hip tender to palpation

## 2011-09-25 NOTE — Op Note (Signed)
NAMEADALIS, GATTI NO.:  [id number]  MEDICAL RECORD NO.:  [id number]  LOCATION:  WOTF                         FACILITY:  [facility]  PHYSICIAN:  Lubertha Basque. Deiondre Harrower, M.D.DATE OF BIRTH:  [birth year]  DATE OF PROCEDURE:  09/25/2011 DATE OF DISCHARGE:                              OPERATIVE REPORT   PREOPERATIVE DIAGNOSIS:  Right femoral neck fracture.  POSTOPERATIVE DIAGNOSIS:  Right femoral neck fracture.  PROCEDURE:  Right hip hemiarthroplasty.  ANESTHESIA:  General.  ATTENDING SURGEON:  Lubertha Basque. Jerl Santos, M.D.  ASSISTANT:  Lindwood Qua, PA  INDICATION FOR PROCEDURE:  The patient is a 77 year old relatively healthy woman, who fell at home earlier today and suffered displaced femoral neck fracture.  She is offered hemiarthroplasty in hopes of allowing her to sit and potentially stand and walk again.  Informed operative consent was obtained after discussion of possible complications including reaction to anesthesia, infection, DVT, dislocation, and death.  SUMMARY OF FINDINGS AND PROCEDURE:  Under general anesthesia and a standard posterior approach, a right hip hemiarthroplasty was performed. She had a displaced femoral neck fracture and fairly good bone quality. There were no degenerative changes.  We addressed her problem with cemented DePuy hemiarthroplasty using a size 3 Summit basic stem cemented in slight anteversion.  This was capped with a size 50+ 0 stainless steel unipolar  head assembly.  Lindwood Qua assisted throughout and was invaluable to the completion of the case, in that, he helped position and retract while I performed the procedure.  He also closed simultaneously to help minimize OR time.  DESCRIPTION OF PROCEDURE:  The patient was taken to the operating suite where general anesthetic was applied without difficulty.  She was positioned in the lateral decubitus position with the right hip up.  All bony prominences were padded,  axillary roll was placed, hip positioners were utilized.  She was then prepped and draped in normal sterile fashion.  After administration of IV Kefzol, a posterior approach was taken to the right hip.  All appropriate anti-infected measures were used including Betadine impregnated drape, and the preoperative IV antibiotic.  Dissection was carried down through some adipose tissue to the IT band and gluteus maximus fascia which were incised longitudinally to expose the short external rotators of the hip.  These structures were tagged and reflected.  A posterior capsulectomy was performed and the femoral head was removed.  This sized to a 50 and a trial head sizing was done which seemed adequate.  There were no degenerative changes in the acetabulum that I could appreciate.  A revision of femoral neck cut was made just above the lesser trochanter.  We then broached up to size 3 followed by trial reduction.  The +0 assembly seemed to give [place] best stability and leg length equality.  A cement spacer was placed distally followed by thorough irrigation of the canal.  This was dried thoroughly.  Cement was mixed and was pressurized into the femoral canal followed by placement of the aforementioned components in slight anteversion.  We did use a centralizer device on the tip of the component.  The pressure was held on the component until the cement had hardened and all excess cement was trimmed.  We then capped it with the aforementioned +0 size 50 stainless steel hip ball.  The hip was reduced and was stable in extension with external rotation as well as flexion with internal rotation.  Leg lengths were felt to be about equal.  The wound was irrigated followed by reapproximation of short external rotators to the greater trochanteric region with nonabsorbable suture. Again, the hip was irrigated followed by reapproximation of IT band and gluteus maximus fascia with #1 Vicryl in interrupted  fashion. Subcutaneous tissues were reapproximated with 0 and 2-0 undyed Vicryl and skin was closed with staples.  A Mepilex dressing was then applied. Estimated blood loss and intraoperative fluids were obtained from anesthesia records.  DISPOSITION:  The patient was extubated in operating room and taken to recovery room in stable addition.  She was to be admitted to the Orthopedic Surgery Service for appropriate postop care to include perioperative antibiotics and Coumadin plus Lovenox for DVT prophylaxis.     Lubertha Basque Jerl Santos, M.D.     PGD/MEDQ  D:  09/25/2011  T:  09/25/2011  Job:  [id number]

## 2011-09-25 NOTE — Progress Notes (Signed)
ANTICOAGULATION CONSULT NOTE - Initial Consult  Pharmacy Consult for Coumadin Indication: VTE prophylaxis  No Known Allergies  Patient Measurements: Height: 5\' 7"  (170.2 cm) Weight: 130 lb (58.968 kg) IBW/kg (Calculated) : 61.6    Vital Signs: Temp: 97.8 F (36.6 C) (12/04 2045) BP: 145/60 mmHg (12/04 2130) Pulse Rate: 75  (12/04 2130)  Labs:  [facility] 09/25/11 0911  HGB 11.7*  HCT 34.3*  PLT 99*  APTT 26  LABPROT 14.0  INR 1.06  HEPARINUNFRC --  CREATININE 0.81  CKTOTAL --  CKMB --  TROPONINI --   Estimated Creatinine Clearance: 43 ml/min (by C-G formula based on Cr of 0.81).  Medical History: Past Medical History  Diagnosis Date  . Hypertension   . Arthritis   . High cholesterol   . Thyroid disease   . Constipation   . Hx-TIA (transient ischemic attack)     Medications:  Scheduled:    . sodium chloride   Intravenous STAT  . ceFAZolin (ANCEF) IV  1 g Intravenous Q8H  . enoxaparin  30 mg Subcutaneous Q12H  . fentaNYL      . fentaNYL  50 mcg Intravenous Once  . sodium chloride  500 mL Intravenous STAT   Infusions:    . lactated ringers 100 mL/hr at 09/25/11 1853  . lactated ringers      Assessment: 77 yr old female s/p hip fracture due to fall.  Pt underwent right hip hemiarthroplasty.  Coumadin to begin for VTE prophylaxis.  Pt on Lovenox 30mg  sq q12h until INR > 2.  Goal of Therapy:  INR 2-3   Plan:  1.  Coumadin 4mg  po x 1 tonight 2.  Coumadin book/video 3.  Check daily PT/INR  Maryellen Pile 09/25/2011,10:06 PM

## 2011-09-25 NOTE — ED Provider Notes (Signed)
History     CSN: [id number] Arrival date & time: 09/25/2011  5:27 AM   First MD Initiated Contact with Patient 09/25/11 0730      Chief Complaint  Patient presents with  . Fall  . Hip Pain   HPI Patient presents to emergency room with complaints of fall. Patient has right hip pain. Patient's landed on her right head. Denies hitting her head or loss of consciousness. Reports that she was getting out of bed and slipped and fell. Denies any chest pain, SOB, or other problems prior to the event. Patient denies any nausea or vomiting. Denies any weakness prior or numbness prior to the event. Reports that the fall was mechanical, in that she slipped.   Past Medical History  Diagnosis Date  . Hypertension   . Arthritis   . High cholesterol   . Thyroid disease   . TIA (transient ischemic attack)   . Constipation     History reviewed. No pertinent past surgical history.  No family history on file.  History  Substance Use Topics  . Smoking status: Never Smoker   . Smokeless tobacco: Not on file  . Alcohol Use: No    OB History    Grav Para Term Preterm Abortions TAB SAB Ect Mult Living                  Review of Systems  Constitutional: Negative for fever, chills, diaphoresis and appetite change.  HENT: Negative for neck pain.   Eyes: Negative for photophobia and visual disturbance.  Respiratory: Negative for cough, chest tightness and shortness of breath.   Cardiovascular: Negative for chest pain.  Gastrointestinal: Negative for nausea, vomiting and abdominal pain.  Genitourinary: Negative for flank pain.  Musculoskeletal: Negative for back pain.  Skin: Negative for rash and wound.  Neurological: Positive for weakness. Negative for numbness.  All other systems reviewed and are negative.    Allergies  Review of patient's allergies indicates no known allergies.  Home Medications   Current Outpatient Rx  Name Route Sig Dispense Refill  . ALENDRONATE SODIUM 10 MG PO  TABS Oral Take 70 mg by mouth once a week. Take with a full glass of water on an empty stomach.     . ASPIRIN 81 MG PO TABS Oral Take 81 mg by mouth daily.      Marland Kitchen FEXOFENADINE HCL 180 MG PO TABS Oral Take 180 mg by mouth daily.      Marland Kitchen LEVOTHYROXINE SODIUM 88 MCG PO TABS Oral Take 88 mcg by mouth daily.      . MULTIVITAMINS PO CAPS Oral Take 1 capsule by mouth daily.      Marland Kitchen OMEPRAZOLE 20 MG PO CPDR Oral Take 20 mg by mouth 2 (two) times daily.     Marland Kitchen SIMVASTATIN 20 MG PO TABS Oral Take 20 mg by mouth at bedtime.      . TRAMADOL HCL 50 MG PO TABS Oral Take 50 mg by mouth every 6 (six) hours as needed. Maximum dose= 8 tablets per day     . VALSARTAN 320 MG PO TABS Oral Take 320 mg by mouth daily.        BP 189/84  Pulse 64  Temp(Src) 98.7 F (37.1 C) (Oral)  Resp 18  SpO2 97%  Physical Exam  Nursing note and vitals reviewed. Constitutional: She appears well-developed and well-nourished. No distress.  HENT:  Head: Normocephalic and atraumatic.  Eyes: EOM are normal. Pupils are equal, round, and reactive  to light.  Neck: Normal range of motion. Neck supple.  Cardiovascular: Normal rate and regular rhythm.   Pulmonary/Chest: Effort normal and breath sounds normal.  Abdominal: Bowel sounds are normal. She exhibits no distension. There is no tenderness.  Musculoskeletal: She exhibits tenderness.       Right hip: She exhibits tenderness, bony tenderness and deformity. She exhibits no laceration.       Legs:      Dorsalis pedis and tibialis pulse intact. Full sensation. Leg shortening on the right and internal rotation to the right leg.   Neurological: She is alert.       Sensation normal  Skin: Skin is warm and dry. She is not diaphoretic.  Psychiatric: She has a normal mood and affect. Her behavior is normal.    ED Course  Procedures (including critical care time)  Patient seen and evaluated.  VSS reviewed. . Nursing notes reviewed. Discussed with attending physician, Dr. Effie Shy.  Initial testing ordered. Will monitor the patient closely. They agree with the treatment plan and diagnosis.  Dg Hip Complete Right  09/25/2011  *RADIOLOGY REPORT*  Clinical Data: Status post fall; right hip pain.  RIGHT HIP - COMPLETE 2+ VIEW  Comparison: None.  Findings: There is a slightly comminuted subcapital fracture through the right femoral neck, with superior displacement of the distal femur.  The right femoral head remains seated at the acetabulum.  There is mild axial joint space narrowing, with associated sclerotic change.  More minimal degenerative change is noted at the left hip.  Mild sclerosis is noted at the sacroiliac joints.  Mild degenerative change is noted at the lower lumbar spine.  The visualized bowel gas pattern is grossly unremarkable in appearance.  IMPRESSION: Slightly comminuted subcapital fracture through the right femoral neck, with superior displacement of the distal femur.  Mild degenerative change noted at the right hip joint.  Original Report Authenticated By: Tonia Ghent, M.D.   Dg Chest Port 1 View  09/25/2011  *RADIOLOGY REPORT*  Clinical Data: Fall, right hip pain  PORTABLE CHEST - 1 VIEW  Comparison: 02/23/2011  Findings: Cardiomediastinal silhouette is stable.  No acute infiltrate or pleural effusion.  No pulmonary edema.  IMPRESSION: No active disease.  Original Report Authenticated By: Natasha Mead, M.D.   7:49 AM Dr. Luiz Blare to see the patient. Hosptialist to admit.   8:20 AM Spoke to Dr. Jarold Motto who will ask Dr. Felipa Eth to admit the patient.    MDM  Right femoral neck fracture        Demetrius Charity, PA 09/25/11 [phone]

## 2011-09-25 NOTE — ED Notes (Signed)
Pt's daughter reports that pt is incoherent and feeling sob. Boneta Lucks, PA notified and will come and evaluate pt. NAD noted at this time.

## 2011-09-25 NOTE — Brief Op Note (Signed)
Summer [name] [id number] 09/25/2011   PRE-OP DIAGNOSIS: right hip femoral neck fracture  POST-OP DIAGNOSIS: same  PROCEDURE: right hip hemiarthroplasty  ANESTHESIA: general  Summer [name]   Dictation #:  [id number]

## 2011-09-25 NOTE — ED Notes (Signed)
Pt awoke this morning with an intention of going to the bathroom.  Pt was closer to the edge of her bed than she thought.  Pt rolled out of her bed and fell to her floor landing on her right hip and arm.  Pt states that her right hip has pain rated @ 10/10 upon movement.  Pt denies pain elsewhere and denies striking her head or LOC.

## 2011-09-25 NOTE — Transfer of Care (Signed)
Immediate Anesthesia Transfer of Care Note  Patient: Summer [name]  Procedure(s) Performed:  ARTHROPLASTY BIPOLAR HIP - Cemented vs Right Hemi Arthroplasty, Right Hip  Patient Location: PACU  Anesthesia Type: General  Level of Consciousness: awake and alert   Airway & Oxygen Therapy: Patient Spontanous Breathing and Patient connected to face mask oxygen  Post-op Assessment: Report given to PACU RN and Post -op Vital signs reviewed and stable  Post vital signs: Reviewed and stable  Complications: No apparent anesthesia complications

## 2011-09-25 NOTE — ED Provider Notes (Signed)
Medical screening examination/treatment/procedure(s) were performed by non-physician practitioner and as supervising physician I was immediately available for consultation/collaboration.  Flint Melter, MD 09/25/11 2103

## 2011-09-25 NOTE — ED Notes (Signed)
Boneta Lucks, PA at bedside to speak with pt and family.

## 2011-09-25 NOTE — Anesthesia Preprocedure Evaluation (Addendum)
Anesthesia Evaluation  Patient identified by MRN, date of birth, ID band Patient awake    Reviewed: Allergy & Precautions, H&P , NPO status , Patient's Chart, lab work & pertinent test results  Airway Mallampati: II TM Distance: >3 FB Neck ROM: Full    Dental No notable dental hx. (+) Edentulous Upper and Edentulous Lower   Pulmonary neg pulmonary ROS,  clear to auscultation  Pulmonary exam normal       Cardiovascular hypertension, neg cardio ROS Regular Normal    Neuro/Psych TIANegative Neurological ROS  Negative Psych ROS   GI/Hepatic negative GI ROS, Neg liver ROS,   Endo/Other  Negative Endocrine ROS  Renal/GU negative Renal ROS  Genitourinary negative   Musculoskeletal negative musculoskeletal ROS (+)   Abdominal   Peds negative pediatric ROS (+)  Hematology negative hematology ROS (+)   Anesthesia Other Findings   Reproductive/Obstetrics negative OB ROS                          Anesthesia Physical Anesthesia Plan  ASA: III  Anesthesia Plan: General   Post-op Pain Management:    Induction: Intravenous  Airway Management Planned: Oral ETT  Additional Equipment:   Intra-op Plan:   Post-operative Plan: Extubation in OR  Informed Consent: I have reviewed the patients History and Physical, chart, labs and discussed the procedure including the risks, benefits and alternatives for the proposed anesthesia with the patient or authorized representative who has indicated his/her understanding and acceptance.   Dental advisory given  Plan Discussed with: CRNA  Anesthesia Plan Comments:         Anesthesia Quick Evaluation

## 2011-09-25 NOTE — H&P (Signed)
Subjective: Patient is a 77 y.o. female who presents with history of a fall at home resulting in inability to walk on the right. This occurred today.  There are no active problems to display for this patient.  Past Medical History  Diagnosis Date  . Hypertension   . Arthritis   . High cholesterol   . Thyroid disease   . TIA (transient ischemic attack)   . Constipation     History reviewed. No pertinent past surgical history.   (Not in a hospital admission) No Known Allergies  History  Substance Use Topics  . Smoking status: Never Smoker   . Smokeless tobacco: Not on file  . Alcohol Use: No    No family history on file.  Review of Systems Pertinent items are noted in HPI.  Objective: Vital signs in last 24 hours: Temp:  [98.7 F (37.1 C)-98.9 F (37.2 C)] 98.9 F (37.2 C) (12/04 0757) Pulse Rate:  [61-64] 61  (12/04 0757) Resp:  [15-25] 16  (12/04 1106) BP: (151-189)/(62-84) 156/67 mmHg (12/04 1106) SpO2:  [97 %-98 %] 98 % (12/04 1106)  BP 156/67  Pulse 61  Temp(Src) 98.9 F (37.2 C) (Oral)  Resp 16  SpO2 98%  General Appearance:    Alert, cooperative, no distress, appears stated age  Head:    Normocephalic, without obvious abnormality, atraumatic  Eyes:    PERRL, conjunctiva/corneas clear, EOM's intact, fundi    benign, both eyes  Ears:    Normal TM's and external ear canals, both ears  Nose:   Nares normal, septum midline, mucosa normal, no drainage    or sinus tenderness  Throat:   Lips, mucosa, and tongue normal; teeth and gums normal  Neck:   Supple, symmetrical, trachea midline, no adenopathy;    thyroid:  no enlargement/tenderness/nodules; no carotid   bruit or JVD  Back:     Symmetric, no curvature, ROM normal, no CVA tenderness  Lungs:     Clear to auscultation bilaterally, respirations unlabored  Chest Wall:    No tenderness or deformity   Heart:    Regular rate and rhythm, S1 and S2 normal, no murmur, rub   or gallop  Breast Exam:    No  tenderness, masses, or nipple abnormality  Abdomen:     Soft, non-tender, bowel sounds active all four quadrants,    no masses, no organomegaly  Genitalia:    Normal female without lesion, discharge or tenderness  Rectal:    Normal tone, normal prostate, no masses or tenderness;   guaiac negative stool  Extremities:   Extremities normal, atraumatic, no cyanosis or edema Right leg is SER and painful to ROM.  ;Right UE painful too but not much different from baseline per daughter  Pulses:   2+ and symmetric all extremities  Skin:   Skin color, texture, turgor normal, no rashes or lesions  Lymph nodes:   Cervical, supraclavicular, and axillary nodes normal  Neurologic:   CNII-XII intact, normal strength, sensation and reflexes    throughout    Imaging Review X-rays of the pelvis show displace right FNFx.  Assessment/Plan: right femoral neck fracture hip fracture  Will proceed with operative fixation. Discussed risks and benefits of surgery including possibilities of complications including reaction to anesthesia, infection, DVT, and even death. Medical consultation for post-operative medical care. I communicated with the PCP that the patient may be at risk for osteoporosis given the nature of the fracture: no

## 2011-09-25 NOTE — Anesthesia Postprocedure Evaluation (Signed)
  Anesthesia Post-op Note  Patient: Summer [name]  Procedure(s) Performed:  ARTHROPLASTY BIPOLAR HIP - Cemented vs Right Hemi Arthroplasty, Right Hip  Patient Location: PACU  Anesthesia Type: General  Level of Consciousness: awake and alert   Airway and Oxygen Therapy: Patient Spontanous Breathing  Post-op Pain: mild  Post-op Assessment: Post-op Vital signs reviewed, Patient's Cardiovascular Status Stable, Respiratory Function Stable, Patent Airway and No signs of Nausea or vomiting  Post-op Vital Signs: stable  Complications: No apparent anesthesia complications

## 2011-09-25 NOTE — ED Notes (Signed)
Phlebotomy notified of need for lab draw. 

## 2011-09-25 NOTE — ED Provider Notes (Signed)
Dr. Jerl Santos will do surgery today at 3 pm.  Spoke to Dr. Felipa Eth who stated that it was fine if [facility] consulted, Dr. Yisroel Ramming requested a consult.  12:42 PM [facility] refused to consult on the patient. Dr. Jerl Santos was in the department and notified. Stated agreement and understanding.    Demetrius Charity, PA 09/25/11 1242

## 2011-09-25 NOTE — ED Provider Notes (Signed)
Medical screening examination/treatment/procedure(s) were performed by non-physician practitioner and as supervising physician I was immediately available for consultation/collaboration.  Flint Melter, MD 09/25/11 2100

## 2011-09-26 LAB — PROTIME-INR
INR: 1.18 (ref 0.00–1.49)
Prothrombin Time: 15.3 seconds — ABNORMAL HIGH (ref 11.6–15.2)

## 2011-09-26 LAB — URINE CULTURE
Colony Count: NO GROWTH
Culture: NO GROWTH

## 2011-09-26 LAB — CBC
HCT: 23.3 % — ABNORMAL LOW (ref 36.0–46.0)
MCHC: 34.3 g/dL (ref 30.0–36.0)
RDW: 13.2 % (ref 11.5–15.5)
WBC: 9.9 10*3/uL (ref 4.0–10.5)

## 2011-09-26 LAB — BASIC METABOLIC PANEL
Chloride: 98 mEq/L (ref 96–112)
GFR calc Af Amer: 72 mL/min — ABNORMAL LOW (ref >90)
GFR calc non Af Amer: 62 mL/min — ABNORMAL LOW (ref >90)
Potassium: 3.9 mEq/L (ref 3.5–5.1)
Sodium: 131 mEq/L — ABNORMAL LOW (ref 135–145)

## 2011-09-26 MED ORDER — SENNOSIDES-DOCUSATE SODIUM 8.6-50 MG PO TABS
1.0000 | ORAL_TABLET | Freq: Two times a day (BID) | ORAL | Status: DC
  Administered 2011-09-26 – 2011-10-03 (×7): 1 via ORAL
  Filled 2011-09-26 (×16): qty 1

## 2011-09-26 MED ORDER — CEFAZOLIN SODIUM 1-5 GM-% IV SOLN
1.0000 g | Freq: Once | INTRAVENOUS | Status: DC
  Filled 2011-09-26: qty 50

## 2011-09-26 MED ORDER — POLYETHYLENE GLYCOL 3350 17 G PO PACK
17.0000 g | PACK | Freq: Every day | ORAL | Status: DC
  Administered 2011-09-26 – 2011-09-29 (×4): 17 g via ORAL
  Filled 2011-09-26 (×8): qty 1

## 2011-09-26 NOTE — Progress Notes (Signed)
Physical Therapy Treatment Patient Details Name: Summer [name] MRN: [id number] DOB: Sep 30, 1919 Today's Date: 09/26/2011 [id number] TA  PT Assessment/Plan  PT - Assessment/Plan PT Frequency: 7X/week Follow Up Recommendations:  (daughter want to take pt home and will be there 24/7) Equipment Recommended: None recommended by PT PT Goals  Acute Rehab PT Goals PT Goal Formulation: With patient/family Time For Goal Achievement: 7 days Pt will go Supine/Side to Sit: with min assist PT Goal: Supine/Side to Sit - Progress: Not met Pt will go Sit to Stand: with min assist PT Goal: Sit to Stand - Progress: Not met Pt will Transfer Bed to Chair/Chair to Bed: with min assist PT Transfer Goal: Bed to Chair/Chair to Bed - Progress: Not met  PT Treatment Precautions/Restrictions  Restrictions Weight Bearing Restrictions: Yes RLE Weight Bearing: Weight bearing as tolerated (per PA note of 09/26/11) Other Position/Activity Restrictions: ?? will patient need to follow post THR precautions?? Mobility (including Balance) Bed Mobility Bed Mobility: Yes Supine to Sit: 1: +2 Total assist;Patient percentage (comment) (pt =0%  pt cries out in pain) Sit to Supine - Left: 1: +2 Total assist;Patient percentage (comment) (pt = 0%, cries out) Sit to Supine - Left Details (indicate cue type and reason): pt was asleep before transfer, not sure if she was able to realize what we were doing to participate, she had pain with movement Transfers Transfers: Yes Squat Pivot Transfers: 1: +2 Total assist;Patient percentage (comment) (pt=0% for back to bed transfer, unable to use walker) Squat Pivot Transfer Details (indicate cue type and reason): pt unwilling/unable to stand with RW because of pain Ambulation/Gait Ambulation/Gait: No Stairs: No    Exercise    End of Session PT - End of Session Equipment Utilized During Treatment: Gait belt Activity Tolerance: Patient limited by pain Patient left: in  bed Nurse Communication: Mobility status for transfers;Mobility status for ambulation General Behavior During Session:  (limited participation because of pain)  Donnetta Hail 09/26/2011, 3:31 PM

## 2011-09-26 NOTE — Progress Notes (Signed)
Physical Therapy Evaluation Patient Details Name: Summer [name] MRN: [id number] DOB: [birth year] Today's Date: 09/26/2011 11:45-12:07 EVII  Recommend HHPT at D/C and HHaide, if covered, Pt may also need ambulette/ambulance transport home  Problem List:  Patient Active Problem List  Diagnoses  . Fracture of femoral neck, right    Past Medical History:  Past Medical History  Diagnosis Date  . Hypertension   . Arthritis   . High cholesterol   . Thyroid disease   . Constipation   . Hx-TIA (transient ischemic attack)    Past Surgical History: History reviewed. No pertinent past surgical history.  PT Assessment/Plan/Recommendation PT Assessment Clinical Impression Statement: 89 yp previoulsy independent woman, now with painful post op hemiparthoplasty of left hip.  Daughter committed to taking her home, but may need some assist getting patien home and up steps to enter...she would benefit form ambulance assist if available if patient does not progress PT Recommendation/Assessment: Patient will need skilled PT in the acute care venue PT Problem List: Decreased strength;Decreased range of motion;Decreased activity tolerance;Decreased mobility;Decreased knowledge of use of DME;Pain Problem List Comments: greatly limited by pain PT Therapy Diagnosis : Difficulty walking;Generalized weakness;Acute pain PT Plan PT Frequency: 7X/week PT Treatment/Interventions: DME instruction;Gait training;Functional mobility training;Stair training;Therapeutic activities;Therapeutic exercise;Patient/family education PT Recommendation Follow Up Recommendations: Home health PT Equipment Recommended: None recommended by PT PT Goals  Acute Rehab PT Goals PT Goal Formulation: With patient/family Time For Goal Achievement: 7 days Pt will go Supine/Side to Sit: with min assist PT Goal: Supine/Side to Sit - Progress: Not met Pt will go Sit to Stand: with min assist PT Goal: Sit to Stand - Progress: Not  met Pt will Transfer Bed to Chair/Chair to Bed: with min assist PT Transfer Goal: Bed to Chair/Chair to Bed - Progress: Not met  PT Evaluation Precautions/Restrictions  Restrictions Weight Bearing Restrictions: Yes RLE Weight Bearing: Weight bearing as tolerated (per PA note of 09/26/11) Other Position/Activity Restrictions: ?? will patient need to follow post THR precautions?? Prior Functioning  Home Living Lives With: Daughter Receives Help From: Family Type of Home: House Home Layout: One level Home Access: Stairs to enter Entrance Stairs-Rails:  (handrails not sturdy) Entrance Stairs-Number of Steps: 3 Home Adaptive Equipment: Wheelchair - manual;Walker - rolling Prior Function Level of Independence: Independent with basic ADLs;Independent with homemaking with ambulation;Independent with gait;Independent with transfers (per daughter) Driving: Yes Cognition Cognition Arousal/Alertness: Awake/alert (pt keeps eyes closed, but will open them) Overall Cognitive Status: Difficult to assess (pt does not say much because she is in "pain") Sensation/Coordination   Extremity Assessment RLE Assessment RLE Assessment: Exceptions to [facility] RLE AROM (degrees) RLE Overall AROM Comments: pt with post op dressing....cries out with any attempt to move LE , small amounts of ankle dorsi/plantar flextion and only initialtion of quads observed LLE Assessment LLE Assessment: Exceptions to [facility] LLE AROM (degrees) LLE Overall AROM Comments: pt able to move left leg, but continues to complain of pain Mobility (including Balance) Bed Mobility Bed Mobility: Yes Supine to Sit: 1: +2 Total assist;Patient percentage (comment) (pt =0%  pt cries out in pain) Transfers Transfers: Yes Squat Pivot Transfers: 1: +2 Total assist;Patient percentage (comment) (pt=0%) Squat Pivot Transfer Details (indicate cue type and reason): pt unwilling/unable to stand with RW because of pain Ambulation/Gait Ambulation/Gait:  No Stairs: No    Exercise    End of Session PT - End of Session Equipment Utilized During Treatment: Gait belt Activity Tolerance: Patient limited by pain Patient left: with family/visitor present;in  chair Nurse Communication: Mobility status for transfers General Behavior During Session:  (limited participation because of pain)  Donnetta Hail 09/26/2011, 2:08 PM

## 2011-09-26 NOTE — Progress Notes (Signed)
Subjective: 1 Day Post-Op Procedure(s) (LRB): ARTHROPLASTY BIPOLAR HIP (Right)  Activity level: oob wbat today Diet tolerance:advance diet Voiding with or without catheter:voiding Patient reports pain as 1 on 0-10 scale.    Objective: Vital signs in last 24 hours: Temp:  [97.8 F (36.6 C)-98 F (36.7 C)] 97.8 F (36.6 C) (12/04 2045) Pulse Rate:  [47-75] 75  (12/04 2130) Resp:  [13-25] 18  (12/04 2130) BP: (132-197)/(58-124) 145/60 mmHg (12/04 2130) SpO2:  [98 %-100 %] 100 % (12/04 2130) Weight:  [58.968 kg (130 lb)] 130 lb (58.968 kg) (12/04 2130)  Intake/Output from previous day: 12/04 0701 - 12/05 0700 In: 2285 [I.V.:1035; IV Piggyback:50] Out: 850 [Urine:550; Blood:300] Intake/Output this shift:     Basename 09/26/11 0335 09/25/11 0911  HGB 8.0* 11.7*    Basename 09/26/11 0335 09/25/11 0911  WBC 9.9 8.7  RBC 2.59* 3.76*  HCT 23.3* 34.3*  PLT 92* 99*    Basename 09/26/11 0335 09/25/11 0911  NA 131* 134*  K 3.9 3.5  CL 98 99  CO2 26 26  BUN 11 9  CREATININE 0.81 0.81  GLUCOSE 140* 125*  CALCIUM 7.8* 9.0    Basename 09/26/11 0335 09/25/11 0911  LABPT -- --  INR 1.18 1.06    Neurologically intact ABD soft Neurovascular intact Sensation intact distally Intact pulses distally Dorsiflexion/Plantar flexion intact No cellulitis present Compartment soft  Assessment/Plan: 1 Day Post-Op Procedure(s) (LRB): ARTHROPLASTY BIPOLAR HIP (Right) Advance diet Up with therapy Plan is to d/c home with daughter and hh for pt and coumadin protocol for 4 weeks in a few days. Will keep an eye on hgb.  Summer [name] R 09/26/2011, 8:07 AM

## 2011-09-26 NOTE — Progress Notes (Signed)
ANTICOAGULATION CONSULT NOTE -Follow up  Pharmacy Consult for Coumadin Indication: VTE prophylaxis  No Known Allergies  Patient Measurements: Height: 5\' 7"  (170.2 cm) Weight: 130 lb (58.968 kg) IBW/kg (Calculated) : 61.6    Labs:  Basename 09/26/11 0335 09/25/11 0911  HGB 8.0* 11.7*  HCT 23.3* 34.3*  PLT 92* 99*  APTT -- 26  LABPROT 15.3* 14.0  INR 1.18 1.06  HEPARINUNFRC -- --  CREATININE 0.81 0.81  CKTOTAL -- --  CKMB -- --  TROPONINI -- --   Estimated Creatinine Clearance: 43 ml/min (by C-G formula based on Cr of 0.81).  Medical History: Past Medical History  Diagnosis Date  . Hypertension   . Arthritis   . High cholesterol   . Thyroid disease   . Constipation   . Hx-TIA (transient ischemic attack)     Medications:  Scheduled:     . sodium chloride   Intravenous STAT  . ceFAZolin (ANCEF) IV  1 g Intravenous Q8H  . enoxaparin  30 mg Subcutaneous Q12H  . fentaNYL      . patient's guide to using coumadin book   Does not apply Once  . polyethylene glycol  17 g Oral Daily  . senna-docusate  1 tablet Oral BID  . warfarin  4 mg Oral Once  . warfarin   Does not apply Once   Infusions:     . lactated ringers 100 mL/hr at 09/25/11 1853  . lactated ringers 1,000 mL (09/26/11 0636)    Assessment: 77 yr old female s/p hip fracture due to fall.  Pt underwent right hip hemiarthroplasty.  Coumadin to begin for VTE prophylaxis.  Pt on Lovenox 30mg  sq q12h until INR > 2. Coumadin 4mg  at 2400 12/4  Goal of Therapy:  INR 2-3   Plan:  1. No further Coumadin today with dose early this am 2.  Coumadin book/video 3.  Check daily PT/INR  Chilton Si, Tyri Elmore L 09/26/2011,1:38 PM

## 2011-09-26 NOTE — Progress Notes (Signed)
Per previous RN, pts foley d/c'd 0730 today. As of 1700 pt had not voided. Pushed po fluids (700cc), bladder scan done for +331, I/O cath performed, 190cc concentrated urine out.  Notified MD, Tisovec.  Orders to resume fluids, cbc/bmet in am. No further i/o or bladder scan required this eve.

## 2011-09-26 NOTE — Progress Notes (Signed)
CSW attempted to visit pt who was resting peacefully in chair. CSW called pts daughter and completed psychosocial assessment (pls see shadow chart). Pts daughter reports she is home with her mother daily and she has promised her mother that she could return home at discharge. Pts daughter states pt as been fairly healthy and her last hospitalization was over 60 years ago, when she gave birth to her. CSW spoke with CM Colleen Can and informed her that physical therapy has recommended [facility] for the pt and pts daughter is in agreement. Pts daughter had concern about pt getting into the home at discharge because they have steps in the front of the home. CSW explained that transportation can be arranged at time of discharge. CSW will continue to follow to ensure transportation needs are addressed.  Summer [name] 09/26/2011 2:39 PM  [phone]

## 2011-09-27 ENCOUNTER — Encounter (HOSPITAL_COMMUNITY): Payer: Self-pay | Admitting: Orthopaedic Surgery

## 2011-09-27 LAB — PROTIME-INR
INR: 1.23 (ref 0.00–1.49)
Prothrombin Time: 15.8 seconds — ABNORMAL HIGH (ref 11.6–15.2)

## 2011-09-27 LAB — BASIC METABOLIC PANEL
CO2: 22 mEq/L (ref 19–32)
Chloride: 94 mEq/L — ABNORMAL LOW (ref 96–112)
Creatinine, Ser: 1.59 mg/dL — ABNORMAL HIGH (ref 0.50–1.10)
GFR calc Af Amer: 32 mL/min — ABNORMAL LOW (ref >90)
Potassium: 4.2 mEq/L (ref 3.5–5.1)
Sodium: 126 mEq/L — ABNORMAL LOW (ref 135–145)

## 2011-09-27 LAB — PREPARE RBC (CROSSMATCH)

## 2011-09-27 LAB — CBC
Platelets: 107 10*3/uL — ABNORMAL LOW (ref 150–400)
RBC: 2.15 MIL/uL — ABNORMAL LOW (ref 3.87–5.11)
RDW: 13.2 % (ref 11.5–15.5)
WBC: 16.6 10*3/uL — ABNORMAL HIGH (ref 4.0–10.5)

## 2011-09-27 MED ORDER — SODIUM CHLORIDE 0.9 % IV SOLN
INTRAVENOUS | Status: DC
  Administered 2011-09-27 – 2011-10-02 (×7): via INTRAVENOUS

## 2011-09-27 MED ORDER — ENSURE PUDDING PO PUDG
1.0000 | Freq: Three times a day (TID) | ORAL | Status: DC
  Administered 2011-09-28 – 2011-10-02 (×15): 1 via ORAL
  Filled 2011-09-27 (×19): qty 1

## 2011-09-27 MED ORDER — SODIUM CHLORIDE 0.9 % IV BOLUS (SEPSIS)
500.0000 mL | Freq: Once | INTRAVENOUS | Status: AC
  Administered 2011-09-27: 07:00:00 via INTRAVENOUS

## 2011-09-27 MED ORDER — WARFARIN SODIUM 4 MG PO TABS
4.0000 mg | ORAL_TABLET | Freq: Once | ORAL | Status: AC
  Administered 2011-09-27: 4 mg via ORAL
  Filled 2011-09-27: qty 1

## 2011-09-27 MED ORDER — ACETAMINOPHEN 325 MG PO TABS
325.0000 mg | ORAL_TABLET | Freq: Once | ORAL | Status: AC
  Administered 2011-09-27: 325 mg via ORAL
  Filled 2011-09-27: qty 1

## 2011-09-27 NOTE — Plan of Care (Signed)
Problem: Phase I Progression Outcomes Goal: Initial discharge plan identified PT now recommending SNF for rehab at discharge.  Daughter now realizes that she cannot physically care for her mother at her current mobility level.

## 2011-09-27 NOTE — Progress Notes (Signed)
Physical Therapy Treatment Patient Details Name: Summer [name] MRN: [id number] DOB: 1919/05/05 Today's Date: 09/27/2011  PT Assessment/Plan  PT - Assessment/Plan Comments on Treatment Session: Patient is very slow to progress.  I do not believe that daughter thought that she would need this much help with her mother.  The daugher has physical injuries (herniated disc and a bad wrist) that would precent her from being able to provide total care for her mom at home.  Even if her daughter could provide total care, two perople are needed to mobilize her safely OOB. Daughter is now agreeable that the safest and most beneficial option for her mom would be to go to SNF for rehab first and then home with her daughter.   PT Plan: Discharge plan needs to be updated;Frequency needs to be updated PT Frequency: Min 3X/week Follow Up Recommendations: Skilled nursing facility Equipment Recommended: Defer to next venue PT Goals  Acute Rehab PT Goals PT Goal: Supine/Side to Sit - Progress: Progressing toward goal PT Goal: Sit to Stand - Progress: Progressing toward goal PT Transfer Goal: Bed to Chair/Chair to Bed - Progress: Progressing toward goal  PT Treatment Precautions/Restrictions  Precautions Precautions: Fall;Posterior Hip Restrictions Weight Bearing Restrictions: Yes RLE Weight Bearing: Weight bearing as tolerated Other Position/Activity Restrictions: ?? will patient need to follow post THR precautions?? Mobility (including Balance) Bed Mobility Supine to Sit: 1: +2 Total assist;Patient percentage (comment);HOB elevated (Comment degrees) (Pt<30%) Supine to Sit Details (indicate cue type and reason): patient needed asssistance of right leg, hips and trunk to get to EOB.  There is some minimal trunk initiation, but patient not helping with arms or her good leg even with verbal cueing.   Transfers Sit to Stand: 1: +2 Total assist;Patient percentage (comment);From elevated surface;With upper  extremity assist;From bed (patient <40%) Sit to Stand Details (indicate cue type and reason): attempted x3 with both hands on RW.  Patient unable to push with bil arms, not taking good weight through legs.  Left knee blocked and max verbal cues to look up and lift her chest.  Patient was unable to support herslef with eigher arm (daughter reports bone sprus in right wrist, but did not even use left arm to help support her body weight and it had no issues).   Stand to Sit: 1: +2 Total assist;Patient percentage (comment) (Patient <30%) Stand to Sit Details: assist needed to help control descent to sit back on bed and into recliner chair.   Squat Pivot Transfers: 1: +2 Total assist;Patient percentage (comment);From elevated surface (patient <40%) Squat Pivot Transfer Details (indicate cue type and reason): patient able to take weight on left foot , but needed physcal assist to support trunk and pivot body around to the chair.  Pateint not helping with arms.  Lift pad in chair for RN staff to get her back to bed later.  Ambulation/Gait Ambulation/Gait: No (the patient is unable at this time. )    Exercise    End of Session PT - End of Session Equipment Utilized During Treatment: Gait belt (RW) Activity Tolerance: Patient limited by fatigue;Patient limited by pain Patient left: in chair;with family/visitor present (daughter in room assisting with treatment session.  ) Nurse Communication: Need for lift equipment;Other (comment) (asked for patient to sit up 2 hours in the chair  ) General Behavior During Session: Lethargic Cognition: Impaired Cognitive Impairment: seems to be slow to process information, is not following verbal cues.   Rollene Rotunda Summer [name], PT, DPT [phone]  09/27/2011, 2:55  PM   

## 2011-09-27 NOTE — Progress Notes (Signed)
CSW called and spoke to pts daughter on the phone and short term rehab was discussed. Pts daughter reports she would still like her mother to come home with [facility], because her mom does not want to go to SNF. Pts daughter also states that if her mom does go to a facility she would have to spend the night with the pt. CSW explained that most facilities will not have room for her to sleep. Pts daughter response is that if she can not stay the night the pt can not go. CSW will e-mail pts daughter the list of facilities and pts daughter plans to call CSW [name] covering for CSW [employer]. CSW will prepare a FL-2 and fax pt out. CSW explained that although pt is being faxed out they can not be forced to go to SNF. CSW will continue to follow and offer support.  Summer [name], LCSWA 09/27/2011 4:04 PM [phone]

## 2011-09-27 NOTE — Progress Notes (Signed)
ANTICOAGULATION CONSULT NOTE -Follow up  Pharmacy Consult for Coumadin Indication: VTE prophylaxis  No Known Allergies  Patient Measurements: Height: 5\' 7"  (170.2 cm) Weight: 130 lb (58.968 kg) IBW/kg (Calculated) : 61.6   Labs:  Basename 09/27/11 0329 09/26/11 0335 09/25/11 0911  HGB 6.8* 8.0* --  HCT 19.3* 23.3* 34.3*  PLT 107* 92* 99*  APTT -- -- 26  LABPROT 15.8* 15.3* 14.0  INR 1.23 1.18 1.06  HEPARINUNFRC -- -- --  CREATININE 1.59* 0.81 0.81  CKTOTAL -- -- --  CKMB -- -- --  TROPONINI -- -- --   Estimated Creatinine Clearance: 21.9 ml/min (by C-G formula based on Cr of 1.59).  Medical History: Past Medical History  Diagnosis Date  . Hypertension   . Arthritis   . High cholesterol   . Thyroid disease   . Constipation   . Hx-TIA (transient ischemic attack)    Medications:  Scheduled:     . acetaminophen  325 mg Oral Once  . ceFAZolin (ANCEF) IV  1 g Intravenous Once  . enoxaparin  30 mg Subcutaneous Q12H  . polyethylene glycol  17 g Oral Daily  . senna-docusate  1 tablet Oral BID  . sodium chloride  500 mL Intravenous Once   Assessment: 77 yr old female s/p hip fracture due to fall.  Pt underwent right hip hemiarthroplasty.  Coumadin to begin for VTE prophylaxis.  Pt on Lovenox 30mg  sq q12h until INR > 2. Coumadin 4mg  at 2400 12/4  Goal of Therapy:  INR 2-3   Plan:  1. INR moving  2. SCr increased, Clearance < 30 ml/min 3. Suggest change Lovenox to 30mg  daily with decr clearance.  4.  Coumadin book/video 5.  Check daily PT/INR 6. Rpt Coumadin 4mg  today.  Chilton Si, Jaiona Simien L 09/27/2011,12:40 PM

## 2011-09-27 NOTE — Progress Notes (Signed)
09/27/2011, Kathi Der RNC-MNN< BSN, [phone], CM spoke with pt's daughter and reviewed MD/PT/OT notes.  Plan is undetermined at this point.  Daughter would like to take pt home if possible with home health services.  Daughter is also willing to consider SNF if that is the recommendation per notes.  Will follow.  Pt's daughter states they have used [facility] before and would like to use them again if [facility] services are needed.

## 2011-09-27 NOTE — Progress Notes (Signed)
Subjective: 2 Days Post-Op Procedure(s) (LRB): ARTHROPLASTY BIPOLAR HIP (Right)  Activity level: Has been up in chair. Diet tolerance:Eating soft food Voiding with or without catheter:foley out but has not voided yet, bolus of fluid given Patient reports pain as 3 on 0-10 scale.    Objective: Vital signs in last 24 hours: Temp:  [98.1 F (36.7 C)-98.6 F (37 C)] 98.1 F (36.7 C) (12/06 0500) Pulse Rate:  [69-86] 86  (12/06 0500) Resp:  [16-21] 21  (12/06 0500) BP: (88-134)/(39-69) 98/52 mmHg (12/06 0533) SpO2:  [97 %-100 %] 97 % (12/06 0500)  Intake/Output from previous day: 12/05 0701 - 12/06 0700 In: 3634.2 [P.O.:2680; I.V.:954.2] Out: 190 [Urine:190] Intake/Output this shift:     Basename 09/27/11 0329 09/26/11 0335 09/25/11 0911  HGB 6.8* 8.0* 11.7*    Basename 09/27/11 0329 09/26/11 0335  WBC 16.6* 9.9  RBC 2.15* 2.59*  HCT 19.3* 23.3*  PLT 107* 92*    Basename 09/27/11 0329 09/26/11 0335  NA 126* 131*  K 4.2 3.9  CL 94* 98  CO2 22 26  BUN 21 11  CREATININE 1.59* 0.81  GLUCOSE 169* 140*  CALCIUM 7.9* 7.8*    Basename 09/27/11 0329 09/26/11 0335  LABPT -- --  INR 1.23 1.18    Neurologically intact ABD soft Neurovascular intact Sensation intact distally Intact pulses distally Dorsiflexion/Plantar flexion intact No cellulitis present Compartment soft  Assessment/Plan: 2 Days Post-Op Procedure(s) (LRB): ARTHROPLASTY BIPOLAR HIP (Right) Advance diet Will give 2 units of prbs's today.. I &O cath prn. Ensure bid to tid. Daughter will consider snf placement if needed.  Summer [name] R 09/27/2011, 9:58 AM

## 2011-09-27 NOTE — Progress Notes (Signed)
Occupational Therapy Note Note pt's Hgb is 6.8 and she is to get 2 units of blood. Spoke with daughter who is interested in pt receiving OT when she is able to progress some with her mobility with PT. Pt was independent with all ADL PTA. Daughter is open to SNF if needed at discharge and discussed benefits of SNF vs HH. Will monitor through PT when pt is progressing with mobility to benefit from OT more.  Judithann Sauger OTR/L [phone] 09/27/2011

## 2011-09-28 LAB — BASIC METABOLIC PANEL
BUN: 19 mg/dL (ref 6–23)
Chloride: 99 mEq/L (ref 96–112)
GFR calc Af Amer: 56 mL/min — ABNORMAL LOW (ref >90)
GFR calc non Af Amer: 48 mL/min — ABNORMAL LOW (ref >90)
Glucose, Bld: 119 mg/dL — ABNORMAL HIGH (ref 70–99)
Potassium: 4.3 mEq/L (ref 3.5–5.1)
Sodium: 128 mEq/L — ABNORMAL LOW (ref 135–145)

## 2011-09-28 LAB — CBC
HCT: 22.3 % — ABNORMAL LOW (ref 36.0–46.0)
Hemoglobin: 7.9 g/dL — ABNORMAL LOW (ref 12.0–15.0)
MCHC: 35.4 g/dL (ref 30.0–36.0)
WBC: 16.4 10*3/uL — ABNORMAL HIGH (ref 4.0–10.5)

## 2011-09-28 LAB — PROTIME-INR
INR: 1.29 (ref 0.00–1.49)
Prothrombin Time: 16.3 seconds — ABNORMAL HIGH (ref 11.6–15.2)

## 2011-09-28 MED ORDER — WARFARIN SODIUM 5 MG PO TABS
5.0000 mg | ORAL_TABLET | Freq: Once | ORAL | Status: AC
  Administered 2011-09-28: 5 mg via ORAL
  Filled 2011-09-28: qty 1

## 2011-09-28 NOTE — Progress Notes (Signed)
HH services arranged for pt as recommended-HH PT/OT/aide.  Equipment recommended also arranged-hospital bed and hoyer.  Services arranged through [facility] as selected by pt's daughter.  Will follow.

## 2011-09-28 NOTE — Progress Notes (Signed)
Subjective: 3 Days Post-Op Procedure(s) (LRB): ARTHROPLASTY BIPOLAR HIP (Right)  Activity level: OOB to chair Diet tolerance:regular Voiding with or without catheter:well Patient reports pain as moderate.    Objective: Vital signs in last 24 hours: Temp:  [98.3 F (36.8 C)-99.5 F (37.5 C)] 99.4 F (37.4 C) (12/07 0700) Pulse Rate:  [83-114] 83  (12/07 0700) Resp:  [18-20] 20  (12/07 0700) BP: (102-136)/(40-71) 136/63 mmHg (12/07 0700) SpO2:  [99 %-100 %] 99 % (12/07 0700)  Intake/Output from previous day: 12/06 0701 - 12/07 0700 In: 4391 [P.O.:2691; Blood:600; IV Piggyback:1100] Out: 675 [Urine:675] Intake/Output this shift:     Basename 09/28/11 0340 09/27/11 0329 09/26/11 0335 09/25/11 0911  HGB 7.9* 6.8* 8.0* 11.7*    Basename 09/28/11 0340 09/27/11 0329  WBC 16.4* 16.6*  RBC 2.63* 2.15*  HCT 22.3* 19.3*  PLT 90* 107*    Basename 09/28/11 0340 09/27/11 0329  NA 128* 126*  K 4.3 4.2  CL 99 94*  CO2 25 22  BUN 19 21  CREATININE 1.00 1.59*  GLUCOSE 119* 169*  CALCIUM 7.6* 7.9*    Basename 09/28/11 0340 09/27/11 0329  LABPT -- --  INR 1.29 1.23    Neurologically intact ABD soft Neurovascular intact Sensation intact distally Intact pulses distally Dorsiflexion/Plantar flexion intact Compartment soft  Assessment/Plan: 3 Days Post-Op Procedure(s) (LRB): ARTHROPLASTY BIPOLAR HIP (Right) Up with therapy Discharge home with home health Will call Dr Felipa Eth about elevated WBC (U/A negative) and hypoNa HCT up after transfusion Social Work involved and looks like will go home with lots of help  Belmira Daley G 09/28/2011, 8:10 AM

## 2011-09-28 NOTE — Progress Notes (Signed)
Physical Therapy Treatment Patient Details Name: Summer [name] MRN: [id number] DOB: [birth year] Today's Date: 09/28/2011  PT Assessment/Plan  PT - Assessment/Plan Comments on Treatment Session: Daughter has once again changed her mind about discharge destination.  She is now stating that she would like to take her mother home and that her nefew will help her for the two weeks that he is off from school.  The patient continues to need extensive 2 person assit to mobilize any even in the bed.  She is very painful when mobilizing despite IV pain meds given prior to working with patient.  Today was her best day so far.  She achieved the most upright standing posture yet and she transfered, although not well with the RW to the recliner.   PT Plan: Discharge plan needs to be updated;Frequency needs to be updated PT Frequency: Min 5X/week Follow Up Recommendations: Home health PT;Other (comment) (HHOT, hospital bed, hoyer, ambulance transfer home, [facility] aide) Equipment Recommended:  (hoyer lift, hospital bed, ambulance transport home) PT Goals  Acute Rehab PT Goals PT Goal: Supine/Side to Sit - Progress: Progressing toward goal PT Goal: Sit to Stand - Progress: Progressing toward goal PT Transfer Goal: Bed to Chair/Chair to Bed - Progress: Progressing toward goal Additional Goals Additional Goal #1: Daughter able to perform HEP with patient with handout PT Goal: Additional Goal #1 - Progress: Progressing toward goal (need to make handout.  )  PT Treatment Precautions/Restrictions  Precautions Precautions: Fall;Posterior Hip Restrictions Weight Bearing Restrictions: Yes RLE Weight Bearing: Weight bearing as tolerated Other Position/Activity Restrictions: ?? will patient need to follow post THR precautions?? Mobility (including Balance) Bed Mobility Supine to Sit: 1: +2 Total assist;Patient percentage (comment);HOB elevated (Comment degrees) (patient <30%) Supine to Sit Details (indicate cue  type and reason): Patient guarded against transfer-not helping , rigid trunk and legs.   Sit to Supine - Left: 1: +2 Total assist Transfers Sit to Stand: 1: +2 Total assist;Patient percentage (comment);From elevated surface;With upper extremity assist;From bed (Patient 40%) Sit to Stand Details (indicate cue type and reason): stood x3 with RW.  Verbal and tactile cues for upright posture, extended trunk and hips.  Assist of trunk over weak and painful legs.  Stood ~45-60 seconds each time.   Stand to Sit: 1: +2 Total assist;To bed;To chair/3-in-1 (patient 40%) Stand to Sit Details: two people needed to control descent to sit.   Squat Pivot Transfers: Patient percentage (comment);1: +2 Total assist;From elevated surface;With upper extremity assistance Squat Pivot Transfer Details (indicate cue type and reason): stand pivot with RW bed to recliner on patient's left.  Patient got 1/2 way to chair and decided to start sitting down.  She was unable to take steps with feet.  Assist needed to make sure she got all the way to the chair.   Ambulation/Gait Ambulation/Gait: No (unable)    Exercise  Total Joint Exercises Ankle Circles/Pumps: AAROM;Both;10 reps;Supine Heel Slides: PROM;Both;10 reps;Supine Hip ABduction/ADduction: PROM;AAROM;Both;10 reps;Supine Long Arc Quad: AROM;AAROM;Both;5 reps;10 reps;Seated End of Session PT - End of Session Equipment Utilized During Treatment: Gait belt (RW, lift pad in the chair) Activity Tolerance: Patient limited by fatigue;Patient limited by pain Patient left: in chair;with call bell in reach;with family/visitor present (daughter present) Nurse Communication: Need for lift equipment;Other (comment) (to get back in bed around 1400) General Behavior During Session: Lethargic Cognitive Impairment: Daughter reports she is HOH-this may be the reason she is not processing verbal cues well.    Rollene Rotunda Edye Hainline, PT, DPT [phone]  09/28/2011, 12:13 PM

## 2011-09-28 NOTE — Plan of Care (Signed)
Problem: Phase II Progression Outcomes Goal: Discharge plan established Outcome: Progressing Daughter now wants to pursue home with HHPT/OT/aide, hoyer, hospital bed and ambulance transport home.  She reports this is what her mother would want and states that she has a nefew coming home from college that can help her for 2 weeks.

## 2011-09-28 NOTE — Progress Notes (Signed)
ANTICOAGULATION CONSULT NOTE -Follow up  Pharmacy Consult for Coumadin Indication: VTE prophylaxis  No Known Allergies  Patient Measurements: Height: 5\' 7"  (170.2 cm) Weight: 130 lb (58.968 kg) IBW/kg (Calculated) : 61.6   Labs:  Basename 09/28/11 0340 09/27/11 0329 09/26/11 0335  HGB 7.9* 6.8* --  HCT 22.3* 19.3* 23.3*  PLT 90* 107* 92*  APTT -- -- --  LABPROT 16.3* 15.8* 15.3*  INR 1.29 1.23 1.18  HEPARINUNFRC -- -- --  CREATININE 1.00 1.59* 0.81  CKTOTAL -- -- --  CKMB -- -- --  TROPONINI -- -- --   Estimated Creatinine Clearance: 34.8 ml/min (by C-G formula based on Cr of 1).  Medical History: Past Medical History  Diagnosis Date  . Hypertension   . Arthritis   . High cholesterol   . Thyroid disease   . Constipation   . Hx-TIA (transient ischemic attack)    Medications:  Scheduled:     . ceFAZolin (ANCEF) IV  1 g Intravenous Once  . enoxaparin  30 mg Subcutaneous Q12H  . feeding supplement  1 Container Oral TID BM  . polyethylene glycol  17 g Oral Daily  . senna-docusate  1 tablet Oral BID  . warfarin  4 mg Oral ONCE-1800   Assessment: 77 yr old female s/p hip fracture due to fall.  Pt underwent right hip hemiarthroplasty.  Coumadin to begin for VTE prophylaxis.  Pt on Lovenox 30mg  sq q12h until INR > 2. Coumadin 4mg  at 2400 12/4  Goal of Therapy:  INR 2-3   Plan:  - INR moving slowly  - Coumacin 5mg  today. - SCr decreased, Cl above 57ml/min -  Coumadin book/video -  Check daily PT/INR   Chilton Si, Katiejo Gilroy L 09/28/2011,2:14 PM

## 2011-09-28 NOTE — Progress Notes (Signed)
OT Note:  Spoke to PT.  Pt still needs A x 2.  Plan is now home.  Will check with pt/family Monday. [place]al, OTR/L [phone] 09/28/2011

## 2011-09-29 ENCOUNTER — Inpatient Hospital Stay (HOSPITAL_COMMUNITY): Payer: Medicare Other

## 2011-09-29 LAB — URINALYSIS, ROUTINE W REFLEX MICROSCOPIC
Bilirubin Urine: NEGATIVE
Hgb urine dipstick: NEGATIVE
Nitrite: NEGATIVE
Protein, ur: NEGATIVE mg/dL
Specific Gravity, Urine: 1.01 (ref 1.005–1.030)
Urobilinogen, UA: 1 mg/dL (ref 0.0–1.0)

## 2011-09-29 LAB — PROTIME-INR: INR: 1.25 (ref 0.00–1.49)

## 2011-09-29 LAB — TSH: TSH: 2.506 u[IU]/mL (ref 0.350–4.500)

## 2011-09-29 LAB — CALCIUM, IONIZED: Calcium, Ion: 1.1 mmol/L — ABNORMAL LOW (ref 1.12–1.32)

## 2011-09-29 MED ORDER — WARFARIN SODIUM 5 MG PO TABS
5.0000 mg | ORAL_TABLET | Freq: Once | ORAL | Status: AC
  Administered 2011-09-29: 5 mg via ORAL
  Filled 2011-09-29: qty 1

## 2011-09-29 NOTE — Progress Notes (Signed)
Subjective: 4 Days Post-Op Procedure(s) (LRB): ARTHROPLASTY BIPOLAR HIP (Right) Patient reports pain as 4 on 0-10 scale.    Objective: Vital signs in last 24 hours: Temp:  [98.5 F (36.9 C)-98.8 F (37.1 C)] 98.8 F (37.1 C) (12/08 0610) Pulse Rate:  [88-98] 93  (12/08 0610) Resp:  [16-18] 16  (12/08 0610) BP: (114-153)/(62-70) 153/70 mmHg (12/08 0610) SpO2:  [98 %-99 %] 98 % (12/08 0610)  Intake/Output from previous day: 12/07 0701 - 12/08 0700 In: 480 [P.O.:480] Out: 1250 [Urine:1250] Intake/Output this shift:     Basename 09/28/11 0340 09/27/11 0329  HGB 7.9* 6.8*    Basename 09/28/11 0340 09/27/11 0329  WBC 16.4* 16.6*  RBC 2.63* 2.15*  HCT 22.3* 19.3*  PLT 90* 107*    Basename 09/28/11 0340 09/27/11 0329  NA 128* 126*  K 4.3 4.2  CL 99 94*  CO2 25 22  BUN 19 21  CREATININE 1.00 1.59*  GLUCOSE 119* 169*  CALCIUM 7.6* 7.9*    Basename 09/29/11 0324 09/28/11 0340  LABPT -- --  INR 1.25 1.29    Neurologically intact Neurovascular intact Sensation intact distally Intact pulses distally Dorsiflexion/Plantar flexion intact No cellulitis present Compartment soft  Assessment/Plan: 4 Days Post-Op Procedure(s) (LRB): ARTHROPLASTY BIPOLAR HIP (Right) Advance diet Work on bowell management Consider SNF  Aroldo Galli L 09/29/2011, 8:13 AM

## 2011-09-29 NOTE — Progress Notes (Addendum)
MEDICINE CONSULT Subjective: The patient is really unable to tell much of anything. She calls out  to her daughter Summer [name] whenever ask any questions. She doesn't seem to let me examine her in a significant way into the not able to see very well. I look back in the notes from before and I can't find any evidence that she has blindness. Clearly she is very confused. I do not see a dementia diagnosis in the past records. She is not on any dementia medicines. PMH:   1. Syncopal episode, most likely vasovagal.   2. Hyponatremia secondary to dehydration, improved.   3. Recurrent constipation secondary to pain medications and probably       element of hypothyroidism.   4. Chronic anemia, etiology not clear.   5. Gastroesophageal reflux disease.   6. Hyperlipidemia.   7. History of transient ischemic attack.   8. Labile hypertension.   9. Mild-to-moderate mitral regurgitation.   10.Grade 1 diastolic dysfunction.  2009:  1. Acute left thalamic cerebrovascular accident.   2. Labile hypertension, now controlled.   3. C4-C5 cord compression.        Objective: Vital signs in last 24 hours: Temp:  [98.5 F (36.9 C)-98.8 F (37.1 C)] 98.8 F (37.1 C) (12/08 0610) Pulse Rate:  [88-98] 93  (12/08 0610) Resp:  [16-18] 16  (12/08 0610) BP: (114-153)/(62-70) 153/70 mmHg (12/08 0610) SpO2:  [98 %-99 %] 98 % (12/08 0610)  Intake/Output from previous day: 12/07 0701 - 12/08 0700 In: 480 [P.O.:480] Out: 1250 [Urine:1250] Intake/Output this shift: Total I/O In: -  Out: 976 [Urine:975; Stool:1]  General: alert were frail elderly black female sitting up wrapped up in her sheets. She seems to have roving eye movements suggesting she can't see. There are some dense cataracts. Oral mucous murmurs are moist. Neck is supple and can't hear any bruits although exam is difficult. Lungs are clear and she's moving air fairly well with no accessory muscles in use. Heart is slightly irregular systolic murmur. Abdomen  soft nontender. Extremities show diminished pulses with no edema. The patient is awake she doesn't seem to understand anything I ask her area she calls out for daughter Summer [name]. She tries to push it away as I try to examine her. She will not follow commands. She does seem to move both arms equally well. Of course her right leg is up on a pillow and I didn't test strength there.  Lab Results   [facility] 09/28/11 0340 09/27/11 0329  WBC 16.4* 16.6*  RBC 2.63* 2.15*  HGB 7.9* 6.8*  HCT 22.3* 19.3*  MCV 84.8 89.8  MCH 30.0 31.6  RDW 16.6* 13.2  PLT 90* 107*    Basename 09/28/11 0340 09/27/11 0329  NA 128* 126*  K 4.3 4.2  CL 99 94*  CO2 25 22  GLUCOSE 119* 169*  BUN 19 21  CREATININE 1.00 1.59*  CALCIUM 7.6* 7.9*    Studies/Results: No results found.  Scheduled Meds:   . ceFAZolin (ANCEF) IV  1 g Intravenous Once  . enoxaparin  30 mg Subcutaneous Q12H  . feeding supplement  1 Container Oral TID BM  . polyethylene glycol  17 g Oral Daily  . senna-docusate  1 tablet Oral BID  . warfarin  5 mg Oral ONCE-1800  . warfarin  5 mg Oral ONCE-1800   Continuous Infusions:   . sodium chloride 100 mL/hr at 09/28/11 1608   PRN Meds:acetaminophen, acetaminophen, fentaNYL, fentaNYL, HYDROcodone-acetaminophen, HYDROmorphone, menthol-cetylpyridinium, methocarbamol(ROBAXIN) IV, methocarbamol, metoCLOPramide (REGLAN)  injection, metoCLOPramide, ondansetron (ZOFRAN) IV, ondansetron, phenol  Assessment/Plan: Postoperative confusion: I would suspect with her age and medical problems that dementia is possibility. The fact that she won't follow any commands however and seemed to have visual changes makes her want to check CT scan. We need to make sure there is  not a occipital process going on Her electrolytes do show some low sodium at this is a bit better. Her glucose is also up which is a new problem. I doubt that she had hypothyroidism but is not receiving supplements. She did have severe  hypothyroidism as part of this problem. She is very thin and chronically unwell appearing we need to rule out cortisol deficiency as well. Obviously we need to limit pain medications if we can. Renal insufficiency: Her creatinine has improved to 1.00 but this is still low GFR based on her body size. Abnormal blood sugars: An A1c will be checked and we are following her sugars twice a day. Hypocalcemia: Per minute check her albumin and ionized calcium but I suspect that she is protein calorie malnutrition. Prior stroke: She is on Coumadin which will help this problem. Hypertension: She is nonspecific medications and blood pressure is labile. We may need to add something more for this..  Hyperlipidemia: She's not been treated at the present time. Leukocytosis: We did check and see if there is a urine infection present. She's received cefazolin. Disposition: Will be pleased to have her on our service if that would be helpful to the orthopedic service  LOS: 4 days   Summer [name] ALAN 09/29/2011, 1:04 PM

## 2011-09-29 NOTE — Progress Notes (Signed)
ANTICOAGULATION CONSULT NOTE -Follow up  Pharmacy Consult for Coumadin Indication: VTE prophylaxis  No Known Allergies  Patient Measurements: Height: 5\' 7"  (170.2 cm) Weight: 130 lb (58.968 kg) IBW/kg (Calculated) : 61.6   Labs:  Basename 09/29/11 0324 09/28/11 0340 09/27/11 0329  HGB -- 7.9* 6.8*  HCT -- 22.3* 19.3*  PLT -- 90* 107*  APTT -- -- --  LABPROT 16.0* 16.3* 15.8*  INR 1.25 1.29 1.23  HEPARINUNFRC -- -- --  CREATININE -- 1.00 1.59*  CKTOTAL -- -- --  CKMB -- -- --  TROPONINI -- -- --   Estimated Creatinine Clearance: 34.8 ml/min (by C-G formula based on Cr of 1).  Medical History: Past Medical History  Diagnosis Date  . Hypertension   . Arthritis   . High cholesterol   . Thyroid disease   . Constipation   . Hx-TIA (transient ischemic attack)    Medications:  Scheduled:     . ceFAZolin (ANCEF) IV  1 g Intravenous Once  . enoxaparin  30 mg Subcutaneous Q12H  . feeding supplement  1 Container Oral TID BM  . polyethylene glycol  17 g Oral Daily  . senna-docusate  1 tablet Oral BID  . warfarin  5 mg Oral ONCE-1800   Assessment: 77 yr old female s/p hip fracture due to fall.  Pt underwent right hip hemiarthroplasty.  Coumadin to begin for VTE prophylaxis.  Pt on Lovenox 30mg  sq q12h until INR > 2. Doses of Warfarin so far: 4mg , 4mg , 5mg   Goal of Therapy:  INR 2-3   Plan:  - INR not responding  - Repeat 5mg  today because expect INR to begin rising at any time - SCr decreased, Cl above 72ml/min -  Coumadin book/video -  Check daily PT/INR   Hessie Knows, PharmD, BCPS 09/29/2011 10:12 AM

## 2011-09-30 LAB — COMPREHENSIVE METABOLIC PANEL
BUN: 12 mg/dL (ref 6–23)
CO2: 27 mEq/L (ref 19–32)
Calcium: 7.7 mg/dL — ABNORMAL LOW (ref 8.4–10.5)
Chloride: 100 mEq/L (ref 96–112)
Creatinine, Ser: 0.69 mg/dL (ref 0.50–1.10)
GFR calc Af Amer: 86 mL/min — ABNORMAL LOW (ref >90)
GFR calc non Af Amer: 74 mL/min — ABNORMAL LOW (ref >90)
Total Bilirubin: 0.6 mg/dL (ref 0.3–1.2)

## 2011-09-30 LAB — PREPARE RBC (CROSSMATCH)

## 2011-09-30 LAB — CBC
Hemoglobin: 6.4 g/dL — CL (ref 12.0–15.0)
Platelets: 192 10*3/uL (ref 150–400)
RBC: 2.09 MIL/uL — ABNORMAL LOW (ref 3.87–5.11)
WBC: 11.5 10*3/uL — ABNORMAL HIGH (ref 4.0–10.5)

## 2011-09-30 MED ORDER — FUROSEMIDE 10 MG/ML IJ SOLN: 40.0000 mg | Freq: Once | INTRAMUSCULAR | Status: DC

## 2011-09-30 MED ORDER — WARFARIN SODIUM 5 MG PO TABS
5.0000 mg | ORAL_TABLET | Freq: Once | ORAL | Status: AC
  Administered 2011-09-30: 5 mg via ORAL
  Filled 2011-09-30: qty 1

## 2011-09-30 MED ORDER — FUROSEMIDE 10 MG/ML IJ SOLN: 20.0000 mg | Freq: Once | INTRAMUSCULAR | Status: DC

## 2011-09-30 MED ORDER — FUROSEMIDE 10 MG/ML IJ SOLN
40.0000 mg | Freq: Once | INTRAMUSCULAR | Status: AC
  Administered 2011-09-30: 40 mg via INTRAVENOUS
  Filled 2011-09-30: qty 4

## 2011-09-30 NOTE — Progress Notes (Addendum)
ANTICOAGULATION CONSULT NOTE -Follow up  Pharmacy Consult for Coumadin Indication: VTE prophylaxis   No Known Allergies  Patient Measurements: Height: 5\' 7"  (170.2 cm) Weight: 130 lb (58.968 kg) IBW/kg (Calculated) : 61.6   Labs:  Basename 09/30/11 0414 09/29/11 0324 09/28/11 0340  HGB 6.4* -- 7.9*  HCT 18.1* -- 22.3*  PLT 192 -- 90*  APTT -- -- --  LABPROT 17.4* 16.0* 16.3*  INR 1.40 1.25 1.29  HEPARINUNFRC -- -- --  CREATININE 0.69 -- 1.00  CKTOTAL -- -- --  CKMB -- -- --  TROPONINI -- -- --   Estimated Creatinine Clearance: 43.5 ml/min (by C-G formula based on Cr of 0.69).  Medical History: Past Medical History  Diagnosis Date  . Hypertension   . Arthritis   . High cholesterol   . Thyroid disease   . Constipation   . Hx-TIA (transient ischemic attack)    Medications:  Scheduled:     . ceFAZolin (ANCEF) IV  1 g Intravenous Once  . enoxaparin  30 mg Subcutaneous Q12H  . feeding supplement  1 Container Oral TID BM  . furosemide  40 mg Intravenous Once  . polyethylene glycol  17 g Oral Daily  . senna-docusate  1 tablet Oral BID  . warfarin  5 mg Oral ONCE-1800  . DISCONTD: furosemide  20 mg Intravenous Once  . DISCONTD: furosemide  40 mg Intravenous Once   Assessment: 77 yr old female s/p hip fracture due to fall.  Pt underwent right hip hemiarthroplasty.  Coumadin started for VTE prophylaxis.  Pt on Lovenox 30mg  sq q12h until INR > 1.8. Doses of Warfarin so far: 4mg , 4mg , 5mg , and 5mg   Goal of Therapy:  INR 1.5 - 2 per ortho notes   Plan:  - INR not responding still - Boost dose to 6mg  today  - Continue Lovenox 30mg  Q12 until INR>=1.8 per orders    Hessie Knows, PharmD, BCPS 09/30/2011 12:02 PM    Addendum:  INR did start to respond some today up to 1.4. Will repeat 5mg  today with new goal being 1.5-2 per ortho notes.   Hessie Knows, PharmD, BCPS 09/30/2011 12:04 PM [phone]

## 2011-09-30 NOTE — Progress Notes (Signed)
PATIENT ID: Summer [name]  MRN: [id number]  DOB/AGE:  1919-07-20 / 77 y.o.  5 Days Post-Op Procedure(s) (LRB): ARTHROPLASTY BIPOLAR HIP (Right)    PROGRESS NOTE Subjective: Patient is alert, oriented,noNausea, no Vomiting, yes passing gas, yes Bowel Movement. Taking PO well. Denies SOB, Chest or Calf Pain. Using Incentive Spirometer, PAS in place. Not ambulating yet Patient reports pain as moderate  .    Objective: Vital signs in last 24 hours: Filed Vitals:   09/28/11 2115 09/29/11 0610 09/29/11 1437 09/30/11 0603  BP: 137/65 153/70 135/64 148/54  Pulse: 98 93 79 80  Temp: 98.5 F (36.9 C) 98.8 F (37.1 C) 99.8 F (37.7 C) 98.3 F (36.8 C)  TempSrc: Oral Oral  Oral  Resp: 18 16 16 17   Height:      Weight:      SpO2: 98% 98% 95% 99%      Intake/Output from previous day: I/O last 3 completed shifts: In: 690 [P.O.:480; I.V.:210] Out: 4128 [Urine:4125; Stool:3]   Intake/Output this shift: Total I/O In: 600 [P.O.:600] Out: -    LABORATORY DATA:  Basename 09/30/11 0414 09/29/11 0324 09/28/11 0340  WBC 11.5* -- 16.4*  HGB 6.4* -- 7.9*  HCT 18.1* -- 22.3*  PLT 192 -- 90*  NA 132* -- 128*  K 4.2 -- 4.3  CL 100 -- 99  CO2 27 -- 25  BUN 12 -- 19  CREATININE 0.69 -- 1.00  GLUCOSE 111* -- 119*  GLUCAP -- -- --  INR 1.40 1.25 --  CALCIUM 7.7* -- --    Examination: Neurologically intact ABD soft Neurovascular intact Sensation intact distally Incision: moderate drainage}  Assessment:   5 Days Post-Op Procedure(s) (LRB): ARTHROPLASTY BIPOLAR HIP (Right) ADDITIONAL DIAGNOSIS:  Acute Blood Loss Anemia  Plan: PT/OT WBAT, THA  posterior precautions.  Receiving 2 units of blood per medicine service.  Dressing change today.  DVT Prophylaxis: Lovenox\Coumadin bridge, monitor INR 1.5-2.0 target  DISCHARGE PLAN: Skilled Nursing Facility/Rehab if unable to meet PT goals  DISCHARGE NEEDS: HHPT, HHRN, Walker and 3-in-1 comode seat

## 2011-09-30 NOTE — Progress Notes (Signed)
CRITICAL VALUE ALERT  Critical value received:  hgb 6.4   Date of notification:  09/30/11  Time of notification:  0545  Critical value read back:yes  Nurse who received alert:  Mashonda Broski, Zachery Dauer  MD notified (1st page):  Dr. Evlyn Kanner  Time of first page:  0550  MD notified (2nd page):  Time of second page:  Responding MD:  Dr. Evlyn Kanner  Time MD responded:  [phone]

## 2011-09-30 NOTE — Progress Notes (Deleted)
reviewed

## 2011-09-30 NOTE — Progress Notes (Signed)
Subjective: Ms Summer [name] is much more interactive today. She keeps her eyes closed but answers questions. No more calling out noted. She does have diffuse pain but cannot localize it. No SOB  Objective: Vital signs in last 24 hours: Temp:  [98.3 F (36.8 C)-99.8 F (37.7 C)] 98.3 F (36.8 C) (12/09 0603) Pulse Rate:  [79-80] 80  (12/09 0603) Resp:  [16-17] 17  (12/09 0603) BP: (135-148)/(54-64) 148/54 mmHg (12/09 0603) SpO2:  [95 %-99 %] 99 % (12/09 0603)  Intake/Output from previous day: 12/08 0701 - 12/09 0700 In: 690 [P.O.:480; I.V.:210] Out: 2878 [Urine:2875; Stool:3] Intake/Output this shift: Total I/O In: 600 [P.O.:600] Out: -   General: alert sitting up with towel on her head. Face symmetric, neck supple, lungs clear. Ht Regular with sys murmur, abd soft, NT, thin extrems. More alert and cooperative  Lab Results   [facility] 09/30/11 0414 09/28/11 0340  WBC 11.5* 16.4*  RBC 2.09* 2.63*  HGB 6.4* 7.9*  HCT 18.1* 22.3*  MCV 86.6 84.8  MCH 30.6 30.0  RDW 15.8* 16.6*  PLT 192 90*    Basename 09/30/11 0414 09/28/11 0340  NA 132* 128*  K 4.2 4.3  CL 100 99  CO2 27 25  GLUCOSE 111* 119*  BUN 12 19  CREATININE 0.69 1.00  CALCIUM 7.7* 7.6*    Studies/Results: Ct Head Wo Contrast  09/29/2011  *RADIOLOGY REPORT*  Clinical Data: Postoperative confusion.  Status post right hip surgery.  Visual deficits.  CT HEAD WITHOUT CONTRAST  Technique:  Contiguous axial images were obtained from the base of the skull through the vertex without contrast.  Comparison: CT head without contrast 02/23/2011 at [facility].  Findings: No acute cortical infarct, hemorrhage, mass lesion is present.  Moderate periventricular and subcortical confluent white matter hypoattenuation is present.  Mild generalized atrophy is stable.  The ventricles are proportionate to the degree of atrophy. No significant extra-axial fluid collection is present.  The wall thickening about the sphenoid sinuses  suggests chronic disease.  No mucosal thickening or fluid levels are present.  The paranasal sinuses are otherwise clear.  The mastoid air cells are clear.  The osseous skull is intact.  Atherosclerotic calcifications are evident within the cavernous carotid arteries.  IMPRESSION:  1.  No acute intracranial abnormality or significant interval change. 2.  Stable atrophy and diffuse white matter disease.  This likely reflects the sequelae of chronic microvascular ischemia. 3.  Atherosclerosis.  Original Report Authenticated By: Jamesetta Orleans. MATTERN, M.D.    Scheduled Meds:   . ceFAZolin (ANCEF) IV  1 g Intravenous Once  . enoxaparin  30 mg Subcutaneous Q12H  . feeding supplement  1 Container Oral TID BM  . furosemide  40 mg Intravenous Once  . polyethylene glycol  17 g Oral Daily  . senna-docusate  1 tablet Oral BID  . warfarin  5 mg Oral ONCE-1800  . warfarin  5 mg Oral ONCE-1800  . DISCONTD: furosemide  20 mg Intravenous Once  . DISCONTD: furosemide  40 mg Intravenous Once   Continuous Infusions:   . sodium chloride 100 mL/hr at 09/28/11 1608   PRN Meds:acetaminophen, acetaminophen, fentaNYL, fentaNYL, HYDROcodone-acetaminophen, HYDROmorphone, menthol-cetylpyridinium, methocarbamol(ROBAXIN) IV, methocarbamol, metoCLOPramide (REGLAN) injection, metoCLOPramide, ondansetron (ZOFRAN) IV, ondansetron, phenol  Assessment/Plan: * Postoperative confusion: This is quite a bit better today. I suspect some underlying dementia is still present Abnormal blood sugars: An A1c will be checked and we are following her sugars twice a day.  Hypocalcemia: Ionized Ca near nl  at 1.10 (nl starts at 1.12).  Prior stroke: She is on Coumadin which will help this problem.  Hypertension: BP is better Hyperlipidemia: She's not been treated at the present time.  Leukocytosis:Improved Anemia: Down to 6,4, transfusion planned Hyponatremia: better at 132. , TSH fine at 2.506  Disposition: Will be pleased to have  her on our service if that would be helpful to the orthopedic service    LOS: 5 days   Summer [name] ALAN 09/30/2011, 12:58 PM

## 2011-09-30 NOTE — Progress Notes (Signed)
Lab came to draw labs that were ordered after initial labs were done for morning collection. Pt's daughter was "very ugly" to lab and IV RN who was present in the room at this time. Pt's daughter then came to night RN and stated that "there needs to be a better system, and I don't want my mother stuck repeatly."  Night RN attempted to explain to pt's daughter concerning why pt needed to be stuck again. Pt's daughter still very upset speaking loudly at the nurses station. Night RN  called MD back and explained. MD d/c cortisol level at this time. Will continue to monitor.

## 2011-09-30 NOTE — Progress Notes (Signed)
CM spoke with family concerning d/c plan. Plan remains for pt to return home with daughter. Per daughter equipment delivered to residence on 12/8. Awaiting MD orders for HHPT/OT/HHA upon discharge. Roxy Manns Maleya Leever,RN,BSN,CM [phone]

## 2011-10-01 LAB — TYPE AND SCREEN
ABO/RH(D): O POS
Antibody Screen: NEGATIVE
Unit division: 0
Unit division: 0

## 2011-10-01 LAB — PROTIME-INR
INR: 1.55 — ABNORMAL HIGH (ref 0.00–1.49)
Prothrombin Time: 18.9 seconds — ABNORMAL HIGH (ref 11.6–15.2)

## 2011-10-01 LAB — BASIC METABOLIC PANEL
Chloride: 96 mEq/L (ref 96–112)
Creatinine, Ser: 0.66 mg/dL (ref 0.50–1.10)
GFR calc Af Amer: 87 mL/min — ABNORMAL LOW (ref >90)
Potassium: 3.6 mEq/L (ref 3.5–5.1)
Sodium: 129 mEq/L — ABNORMAL LOW (ref 135–145)

## 2011-10-01 LAB — CBC
MCV: 85.3 fL (ref 78.0–100.0)
Platelets: 224 10*3/uL (ref 150–400)
RDW: 15 % (ref 11.5–15.5)
WBC: 11.8 10*3/uL — ABNORMAL HIGH (ref 4.0–10.5)

## 2011-10-01 MED ORDER — OLMESARTAN MEDOXOMIL 20 MG PO TABS
20.0000 mg | ORAL_TABLET | Freq: Every day | ORAL | Status: DC
  Administered 2011-10-01 – 2011-10-03 (×3): 20 mg via ORAL
  Filled 2011-10-01 (×3): qty 1

## 2011-10-01 MED ORDER — HYDROMORPHONE HCL PF 1 MG/ML IJ SOLN: 0.5000 mg | INTRAMUSCULAR | Status: DC | PRN

## 2011-10-01 MED ORDER — WARFARIN SODIUM 5 MG PO TABS
5.0000 mg | ORAL_TABLET | Freq: Once | ORAL | Status: AC
  Administered 2011-10-01: 5 mg via ORAL
  Filled 2011-10-01: qty 1

## 2011-10-01 MED ORDER — LEVOTHYROXINE SODIUM 88 MCG PO TABS
88.0000 ug | ORAL_TABLET | Freq: Every day | ORAL | Status: DC
  Administered 2011-10-01 – 2011-10-03 (×3): 88 ug via ORAL
  Filled 2011-10-01 (×3): qty 1

## 2011-10-01 MED ORDER — PANTOPRAZOLE SODIUM 40 MG PO TBEC
40.0000 mg | DELAYED_RELEASE_TABLET | Freq: Every day | ORAL | Status: DC
  Administered 2011-10-02 – 2011-10-03 (×2): 40 mg via ORAL
  Filled 2011-10-01 (×3): qty 1

## 2011-10-01 NOTE — Progress Notes (Signed)
Subjective: 6 Days Post-Op Procedure(s) (LRB): ARTHROPLASTY BIPOLAR HIP (Right) Blood transfused Activity level: to work with pt today Diet tolerance:eating soft diet Voiding with or without catheter:voiding Patient reports pain as 3 on 0-10 scale.    Objective: Vital signs in last 24 hours: Temp:  [97.8 F (36.6 C)-100 F (37.8 C)] 100 F (37.8 C) (12/09 2230) Pulse Rate:  [80-89] 80  (12/09 2230) Resp:  [16-20] 16  (12/09 2230) BP: (139-184)/(53-80) 155/53 mmHg (12/09 2230) SpO2:  [96 %-99 %] 99 % (12/09 2230)  Intake/Output from previous day: 12/09 0701 - 12/10 0700 In: 1800 [P.O.:1200; Blood:600] Out: 1000 [Urine:1000] Intake/Output this shift: Total I/O In: 350 [P.O.:350] Out: 450 [Urine:450]   Basename 10/01/11 0353 09/30/11 0414  HGB 9.1* 6.4*    Basename 10/01/11 0353 09/30/11 0414  WBC 11.8* 11.5*  RBC 3.07* 2.09*  HCT 26.2* 18.1*  PLT 224 192    Basename 10/01/11 0353 09/30/11 0414  NA 129* 132*  K 3.6 4.2  CL 96 100  CO2 27 27  BUN 14 12  CREATININE 0.66 0.69  GLUCOSE 117* 111*  CALCIUM 7.8* 7.7*    Basename 10/01/11 0353 09/30/11 0414  LABPT -- --  INR 1.55* 1.40    Neurologically intact ABD soft Neurovascular intact Sensation intact distally Intact pulses distally Dorsiflexion/Plantar flexion intact No cellulitis present Compartment soft Less confused Assessment/Plan: 6 Days Post-Op Procedure(s) (LRB): ARTHROPLASTY BIPOLAR HIP (Right) with BLA Advance diet Up with therapy Most likely needs SNF  But daughter would like to take home. Work with therapy, may be wbat with assist. Transfer to Dr. Evlyn Kanner / [facility] service per Dr. Rinaldo Cloud request.   Lindwood Qua R 10/01/2011, 9:33 AM

## 2011-10-01 NOTE — Progress Notes (Signed)
ANTICOAGULATION CONSULT NOTE - Follow Up Consult  Pharmacy Consult for Warfarin Indication: DVT ppx, s/p R Hip arthroplasty  No Known Allergies  Patient Measurements: Height: 5\' 7"  (170.2 cm) Weight: 130 lb (58.968 kg) IBW/kg (Calculated) : 61.6    Vital Signs: Temp: 100 F (37.8 C) (12/09 2230) Temp src: Oral (12/09 2230) BP: 155/53 mmHg (12/09 2230) Pulse Rate: 80  (12/09 2230)  Labs:  Basename 10/01/11 0353 09/30/11 0414 09/29/11 0324  HGB 9.1* 6.4* --  HCT 26.2* 18.1* --  PLT 224 192 --  APTT -- -- --  LABPROT 18.9* 17.4* 16.0*  INR 1.55* 1.40 1.25  HEPARINUNFRC -- -- --  CREATININE 0.66 0.69 --  CKTOTAL -- -- --  CKMB -- -- --  TROPONINI -- -- --   Estimated Creatinine Clearance: 43.5 ml/min (by C-G formula based on Cr of 0.66).   Medications:  Scheduled:    . ceFAZolin (ANCEF) IV  1 g Intravenous Once  . enoxaparin  30 mg Subcutaneous Q12H  . feeding supplement  1 Container Oral TID BM  . furosemide  40 mg Intravenous Once  . polyethylene glycol  17 g Oral Daily  . senna-docusate  1 tablet Oral BID  . warfarin  5 mg Oral ONCE-1800   Infusions:    . sodium chloride 100 mL/hr at 10/01/11 0847   PRN: acetaminophen, acetaminophen, fentaNYL, fentaNYL, HYDROcodone-acetaminophen, HYDROmorphone, menthol-cetylpyridinium, methocarbamol(ROBAXIN) IV, methocarbamol, metoCLOPramide (REGLAN) injection, metoCLOPramide, ondansetron (ZOFRAN) IV, ondansetron, phenol  Assessment: 77 yo F on warfain for DVT ppx s/p R hip arthroplasty (4 ,4, 5, 5, 5mg  12/5-9). Also on Lovenox 30mg  sq q12h until INR >1.8. No bleeding reported. Hgb up now, blood given yesterday. INR up and is therapeutic.  Goal of Therapy:  INR 1.5-2 per ortho   Plan:  Warfarin 4mg  today. F/U AM INR. D/C Lovenox when INR >1.8  Gwen Her PharmD  [phone] 10/01/2011 9:36 AM

## 2011-10-01 NOTE — Progress Notes (Signed)
CSW had been following for possible SNF. CSW found family not wanting placement on 09/27/11. CSW can assist with transportation home if needed.  Vennie Homans, LCSWA for Wyvonna Plum 10/01/2011 8:51 AM [phone]

## 2011-10-01 NOTE — Progress Notes (Signed)
Occupational Therapy Evaluation Patient Details Name: Summer [name] MRN: [id number] DOB: 1918-11-17 Today's Date: 10/01/2011  Problem List:  Patient Active Problem List  Diagnoses  . Fracture of femoral neck, right   EV2  [id number] Past Medical History:  Past Medical History  Diagnosis Date  . Hypertension   . Arthritis   . High cholesterol   . Thyroid disease   . Constipation   . Hx-TIA (transient ischemic attack)    Past Surgical History:  Past Surgical History  Procedure Date  . Hip arthroplasty 09/25/2011    Procedure: ARTHROPLASTY BIPOLAR HIP;  Surgeon: Velna Ochs, MD;  Location: [facility]S;  Service: Orthopedics;  Laterality: Right;  Cemented vs Right Hemi Arthroplasty, Right Hip    OT Assessment/Plan/Recommendation OT Assessment Clinical Impression Statement: Pt s/p R THR with deficits in problem areas listed below.  Pt would benfit from cont OT to increase independence with basic adls since she was I prior to admit. OT Recommendation/Assessment: Patient will need skilled OT in the acute care venue OT Problem List: Decreased activity tolerance;Decreased cognition;Decreased safety awareness;Decreased knowledge of use of DME or AE;Decreased knowledge of precautions;Pain OT Therapy Diagnosis : Generalized weakness;Acute pain OT Plan OT Frequency: Min 2X/week OT Treatment/Interventions: Self-care/ADL training;DME and/or AE instruction;Therapeutic activities OT Recommendation Follow Up Recommendations: Home health OT;Other (comment) (Daughter wishes to take pt home.) Equipment Recommended: Other (comment) (adaptive equipment pack from gift store.) Individuals Consulted Consulted and Agree with Results and Recommendations: Patient OT Goals Acute Rehab OT Goals OT Goal Formulation: With patient Time For Goal Achievement: 2 weeks ADL Goals Pt Will Perform Grooming: with min assist;Standing at sink;Supported ADL Goal: Grooming - Progress: Progressing toward  goals Pt Will Perform Lower Body Bathing: with mod assist;Sit to stand from chair;with adaptive equipment ADL Goal: Lower Body Bathing - Progress: Progressing toward goals Pt Will Perform Lower Body Dressing: with mod assist;Sit to stand from chair;with adaptive equipment ADL Goal: Lower Body Dressing - Progress: Progressing toward goals Pt Will Perform Tub/Shower Transfer: Tub transfer;with min assist;with caregiver independent in assisting;Stand pivot transfer;Shower seat with back;Maintaining hip precautions ADL Goal: Tub/Shower Transfer - Progress: Progressing toward goals Additional ADL Goal #1: Pt will perform all aspects of toileting with min assist and min cues for maintaining hip precautions. ADL Goal: Additional Goal #1 - Progress: Progressing toward goals  OT Evaluation Precautions/Restrictions  Precautions Precautions: Fall;Posterior Hip Precaution Booklet Issued: Yes (comment) Precaution Comments: daughter given handout w precautions.  Pt does not understand precautions at this point. Required Braces or Orthoses: Yes Knee Immobilizer: Other (comment) (just in bed to reinforce precautions.) Restrictions Weight Bearing Restrictions: Yes RLE Weight Bearing: Weight bearing as tolerated Other Position/Activity Restrictions: ?? will patient need to follow post THR precautions?? Prior Functioning Home Living Lives With: Daughter Receives Help From: Family Type of Home: House Home Layout: One level Home Access: Stairs to enter [employer] of Steps: 3 Bathroom Shower/Tub: Forensic scientist: Standard Bathroom Accessibility: No Home Adaptive Equipment: Bedside commode/3-in-1;Hospital bed;Shower chair with back;Tub transfer bench;Wheelchair - manual (hoyer lift) Prior Function Level of Independence: Independent with basic ADLs;Independent with homemaking with ambulation;Independent with gait;Independent with transfers (Per daughter, pt was I w  everything including cognition.) Able to Take Stairs?: Yes Driving: Yes Vocation: Retired Leisure: Hobbies-yes (Comment) Comments: gardening. ADL ADL Eating/Feeding: Simulated;Set up Where Assessed - Eating/Feeding: Bed level Grooming: Simulated;Other (comment) (cues for cognition as pt confused at times.) Where Assessed - Grooming: Sitting, chair Upper Body Bathing: Simulated;Minimal assistance Upper Body Bathing Details (  indicate cue type and reason): min assist to reach all areas. Where Assessed - Upper Body Bathing: Sitting, chair Lower Body Bathing: Maximal assistance;Simulated Lower Body Bathing Details (indicate cue type and reason): assist to stand/reach bottom and wash below knees without equipment. Where Assessed - Lower Body Bathing: Sit to stand from chair Upper Body Dressing: Performed;Minimal assistance Upper Body Dressing Details (indicate cue type and reason): assist with hospital gown. Where Assessed - Upper Body Dressing: Sitting, bed Lower Body Dressing: Simulated;Maximal assistance Lower Body Dressing Details (indicate cue type and reason): no equipment available.  pt too confused to explain use of AE in regards to hip precautions. Where Assessed - Lower Body Dressing: Sit to stand from chair Toilet Transfer: Performed;+2 Total assistance;Other (comment);Comment for patient % (pt did 60% of transfer to toilet) Toilet Transfer Details (indicate cue type and reason): pt pivoted for transfer requiring cues for which feet to move when and cues to reach back for [facility] and kick R foot out to avoid breaking hip precautions. Toilet Transfer Method: Ambulance person: Programme researcher, broadcasting/film/video Manipulation: Simulated;+1 Total assistance Toileting - Clothing Manipulation Details (indicate cue type and reason): pt unable to let go of walker to manage clothes. Where Assessed - Toileting Clothing Manipulation: Standing Toileting - Hygiene:  Simulated;Moderate assistance Where Assessed - Toileting Hygiene: Sit on 3-in-1 or toilet Tub/Shower Transfer: Not assessed Equipment Used: Rolling walker ADL Comments: pt currently requires a great amount of assist with adls. Pt's daughter is planning on staing with patient 24 hours a day. Vision/Perception  Vision - History Baseline Vision: No visual deficits Visual History: Cataracts Patient Visual Report: No change from baseline Cognition Cognition Arousal/Alertness: Awake/alert Overall Cognitive Status: Impaired Memory: Appears impaired Memory Deficits: Pt unable to recall city she lives in, house set up or where she currently is. Orientation Level: Oriented to person Awareness of Errors: Decreased awareness of errors made Decreased Awareness of Errors: Assistance required to correct errors made;Assistance required to identify errors made Awareness of Deficits: Decreased awareness of deficits Awareness of Deficits - Other Comments: unclear about hip precautions. Problem Solving: Requires assistance for problem solving Executive Functioning: Pt was previously driving.  Great concern for this since cognition is now severely imparied as is hearing. Cognition - Other Comments: Daughter states pt was previously with no cognitive deficits.  Now, pt with poor orientation, memory, problem solving, attn, and hearing is impaired making pt a safety risk. Sensation/Coordination Sensation Light Touch: Appears Intact Coordination Gross Motor Movements are Fluid and Coordinated: Yes Fine Motor Movements are Fluid and Coordinated: Yes Extremity Assessment RUE Assessment RUE Assessment: Within Functional Limits LUE Assessment LUE Assessment: Within Functional Limits Mobility  Bed Mobility Bed Mobility: Yes Supine to Sit: 1: +2 Total assist;Patient percentage (comment);HOB flat;With rails (Pt did 40%) Supine to Sit Details (indicate cue type and reason): Pt had difficulty moving hips.  Pt  assists with UE but having a lot of pain in LE. Transfers Transfers: Yes Sit to Stand: 1: +2 Total assist;Patient percentage (comment);With upper extremity assist;From elevated surface;With armrests;From bed (Pt 60%) Stand to Sit: 1: +2 Total assist;With armrests;To chair/3-in-1 (Pt 60%) Stand to Sit Details: Pt required cues to reach back and kick R leg out. Exercises   End of Session OT - End of Session Equipment Utilized During Treatment: Gait belt Activity Tolerance: Patient tolerated treatment well Patient left: in chair;with family/visitor present;with call bell in reach Nurse Communication: Mobility status for transfers General Behavior During Session: [facility] for  tasks performed Cognition: Impaired Cognitive Impairment: Pt not oriented, poor memory, slow to process.   Hope Budds [phone] 10/01/2011, 11:17 AM

## 2011-10-01 NOTE — Progress Notes (Signed)
Subjective: Patient alert, completed physical therapy this morning, did relatively well, continues to need 2 person assist and will need a Hoyer lift at home. Has used a walker-dependent and transfer but no significant walking as of yet. Daughter present, discussed extensively, patient sleepy after pain medication administration, wants to sleep yet denies any chest pain, shortness of breath, nausea, vomiting, tolerating current diet without any incident, no coughing, significant loose bowels over the weekend while on bowel regimen, now bowel regimen held with soft stools as a result. No Foley catheter present. Memory continues to be an issue in the setting of mild dementia, possibly complicated by the administration of pain medications. Daughter still wants to take the patient home with extensive help of home health, deferred skilled nursing facility given the experiences of other family members. Patient herself has stated to the daughter that she is adamantly opposed to skilled nursing facility. Review of systems as above, no fevers, chills  Objective: Vital signs in last 24 hours: Temp:  [97.8 F (36.6 C)-100 F (37.8 C)] 99.8 F (37.7 C) (12/10 1215) Pulse Rate:  [80-89] 89  (12/10 1215) Resp:  [16-20] 20  (12/10 1215) BP: (139-184)/(53-80) 156/78 mmHg (12/10 1215) SpO2:  [93 %-99 %] 93 % (12/10 1215) Weight change:  Last BM Date: 10/01/11  CBG (last 3)  No results found for this basename: GLUCAP:3 in the last 72 hours  Intake/Output from previous day: 12/09 0701 - 12/10 0700 In: 1800 [P.O.:1200; Blood:600] Out: 1000 [Urine:1000] Intake/Output this shift: Total I/O In: 350 [P.O.:350] Out: 450 [Urine:450]  General appearance: alert, appears stated age, cachectic, fatigued and no distress Eyes: no scleral icterus Throat: oropharynx moist without erythema Resp: clear to auscultation bilaterally Cardio: regular rate and rhythm, S1, S2 normal, no murmur, click, rub or  gallop Extremities: no clubbing, cyanosis or edema Result 4 extremities without hindrance, for regulation, lifting legs off the bed not tested.   Lab Results:  [facility] 10/01/11 0353 09/30/11 0414  NA 129* 132*  K 3.6 4.2  CL 96 100  CO2 27 27  GLUCOSE 117* 111*  BUN 14 12  CREATININE 0.66 0.69  CALCIUM 7.8* 7.7*  MG -- --  PHOS -- --    Basename 09/30/11 0414  AST 31  ALT 14  ALKPHOS 64  BILITOT 0.6  PROT 5.4*  ALBUMIN 1.9*    Basename 10/01/11 0353 09/30/11 0414  WBC 11.8* 11.5*  NEUTROABS -- --  HGB 9.1* 6.4*  HCT 26.2* 18.1*  MCV 85.3 86.6  PLT 224 192   No results found for this basename: CKTOTAL:3,CKMB:3,CKMBINDEX:3,TROPONINI:3 in the last 72 hours  Basename 09/29/11 1400  TSH 2.506  T4TOTAL --  T3FREE --  THYROIDAB --   No results found for this basename: VITAMINB12:2,FOLATE:2,FERRITIN:2,TIBC:2,IRON:2,RETICCTPCT:2 in the last 72 hours  Studies/Results: Ct Head Wo Contrast  09/29/2011  *RADIOLOGY REPORT*  Clinical Data: Postoperative confusion.  Status post right hip surgery.  Visual deficits.  CT HEAD WITHOUT CONTRAST  Technique:  Contiguous axial images were obtained from the base of the skull through the vertex without contrast.  Comparison: CT head without contrast 02/23/2011 at [facility].  Findings: No acute cortical infarct, hemorrhage, mass lesion is present.  Moderate periventricular and subcortical confluent white matter hypoattenuation is present.  Mild generalized atrophy is stable.  The ventricles are proportionate to the degree of atrophy. No significant extra-axial fluid collection is present.  The wall thickening about the sphenoid sinuses suggests chronic disease.  No mucosal thickening or fluid  levels are present.  The paranasal sinuses are otherwise clear.  The mastoid air cells are clear.  The osseous skull is intact.  Atherosclerotic calcifications are evident within the cavernous carotid arteries.  IMPRESSION:  1.  No acute  intracranial abnormality or significant interval change. 2.  Stable atrophy and diffuse white matter disease.  This likely reflects the sequelae of chronic microvascular ischemia. 3.  Atherosclerosis.  Original Report Authenticated By: Jamesetta Orleans. MATTERN, M.D.     Medications: Scheduled:   . ceFAZolin (ANCEF) IV  1 g Intravenous Once  . enoxaparin  30 mg Subcutaneous Q12H  . feeding supplement  1 Container Oral TID BM  . furosemide  40 mg Intravenous Once  . polyethylene glycol  17 g Oral Daily  . senna-docusate  1 tablet Oral BID  . warfarin  5 mg Oral ONCE-1800  . warfarin  5 mg Oral ONCE-1800   Continuous:   . sodium chloride 100 mL/hr at 10/01/11 4098    Assessment/Plan: Active Problems:  Fracture of femoral neck, right  continues to work with physical and occupational therapy, requiring 2+ assist along with Summer [name] lift, daughter has arranged home health along with physical and occupational therapy, outside help as needed, ramp to be built, extra help coming from family members coming home for the holidays, daughter and patient adamantly opposed to going to skilled nursing facility. Discussed the risk involved, and the need for adequate help. Patient's daughter states that she will obtain outside help at additional expense if required. Weightbearing as tolerated with assistance  DVT prophylaxis-currently on Coumadin, not therapeutic, subcutaneous Lovenox as a bridge, will need to continue this until therapeutic, will need to be checked by home health RN, no overt bleeding complications. Per orthopedics we'll discontinue subcutaneous Lovenox once PT/INR greater than 1.8.  Anemia secondary to blood loss during surgery, question need for additional iron supplementation, will check iron levels in the morning, along with TIBC, will recheck CBC for stability, patient previously on proton pump inhibitor at home, will restart for protection given now anticoagulation  needs.  Hypertension-uncontrolled, will restart her on Diovan use History of hypothyroidism currently on supplementation at home, will restart at previous dose, TSH surely after admission normal  Dementia-mild, history of using Aricept, stopped by patient, may need to reinitiate 1 stable at home, complicated by hospital setting, use of pain medications, hopefully will improve at home and normal setting. Altered mental status-secondary to postoperative influences, now much improved, we'll minimize pain medications as tolerated, head CT unremarkable.  Disposition-hopefully home in the week with home health physical therapy, occupational therapy, RN, monitoring of Coumadin, will need TED stockings, close follow up with orthopedics, followup for anemia   LOS: 6 days   Summer [name] R 10/01/2011, 12:29 PM

## 2011-10-01 NOTE — Progress Notes (Signed)
Physical Therapy Treatment Patient Details Name: Summer [name] MRN: [id number] DOB: 26-Sep-1919 Today's Date: 10/01/2011 [phone] 2TA,1TE  PT Assessment/Plan  PT - Assessment/Plan Comments on Treatment Session: Patient improving with tolerance to mobility as well as decreased level of skilled assist necessary for transfers.  Will continue to need +2 assist or hoyer lift for transfers with family.  Daughter has arranged home, arranged to have +2 help and gotten equipment from [facility] agency already placed in the home,.  Still needs to get ramp built and encouraged her to contact local building supply store for contact who can reputably build her a ramp. PT Plan: Discharge plan remains appropriate PT Frequency: Min 5X/week Follow Up Recommendations: Home health PT Equipment Recommended: Other (comment) (reports already recieved hospital bed and hoyer lift) PT Goals  Acute Rehab PT Goals PT Goal: Supine/Side to Sit - Progress: Progressing toward goal PT Goal: Sit to Stand - Progress: Progressing toward goal PT Transfer Goal: Bed to Chair/Chair to Bed - Progress: Progressing toward goal Additional Goals PT Goal: Additional Goal #1 - Progress: Progressing toward goal  PT Treatment Precautions/Restrictions  Precautions Precautions: Posterior Hip Precaution Booklet Issued: Yes (comment) Required Braces or Orthoses: Yes Knee Immobilizer: Other (comment) (in bed for hip precautions) Restrictions Weight Bearing Restrictions: Yes RLE Weight Bearing: Weight bearing as tolerated Other Position/Activity Restrictions: patient may need to follow post THR precautions Mobility (including Balance) Bed Mobility Bed Mobility: Yes Supine to Sit: 1: +1 Total assist;HOB flat Supine to Sit Details (indicate cue type and reason): pt=40% Transfers Sit to Stand: 1: +2 Total assist;From bed;With upper extremity assist Sit to Stand Details (indicate cue type and reason): pt=60%, cues for hip precautions,  to push from bed and for anterior weight shift. Stand to Sit: 1: +2 Total assist;To chair/3-in-1;With upper extremity assist Stand to Sit Details: pt=60%, needed mod instructional and tactile cues for sequence, safety and hip precautions. Stand Pivot Transfers: 1: +2 Total assist Stand Pivot Transfer Details (indicate cue type and reason): with rolling walker and pt=60% with cues for upright posture, sequencing, backing up to chair, etc.  Posture/Postural Control Posture/Postural Control: Postural limitations Postural Limitations: Pt forward flexed with foward head and rounded shoulders throughout session. Exercise  Total Joint Exercises Ankle Circles/Pumps: AROM;Both;10 reps;Seated Short Arc Quad: AROM;AAROM;Right;Left;10 reps;Supine (assist for right) Heel Slides: AAROM;AROM;Right;Left;10 reps;Supine (assist for right ) End of Session PT - End of Session Equipment Utilized During Treatment: Gait belt Activity Tolerance: Patient limited by pain;Patient limited by fatigue Patient left: in chair;with family/visitor present General Behavior During Session: [facility] for tasks performed Cognition: Impaired Cognitive Impairment: not oriented, needs extra time and repeated instructions for patient to follow multiple step commands.  Taniya Dasher,CYNDI 10/01/2011, 11:35 AM

## 2011-10-02 LAB — CBC
HCT: 24.6 % — ABNORMAL LOW (ref 36.0–46.0)
Hemoglobin: 8.7 g/dL — ABNORMAL LOW (ref 12.0–15.0)
MCHC: 35.4 g/dL (ref 30.0–36.0)
RDW: 14.7 % (ref 11.5–15.5)
WBC: 12.4 10*3/uL — ABNORMAL HIGH (ref 4.0–10.5)

## 2011-10-02 LAB — PROTIME-INR
INR: 1.93 — ABNORMAL HIGH (ref 0.00–1.49)
Prothrombin Time: 22.4 seconds — ABNORMAL HIGH (ref 11.6–15.2)

## 2011-10-02 MED ORDER — HYDROCODONE-ACETAMINOPHEN 5-325 MG PO TABS: 1.0000 | ORAL_TABLET | ORAL | Status: AC | PRN

## 2011-10-02 MED ORDER — POLYETHYLENE GLYCOL 3350 17 G PO PACK: 17.0000 g | PACK | Freq: Every day | ORAL | Status: AC

## 2011-10-02 MED ORDER — LEVOTHYROXINE SODIUM 88 MCG PO TABS: 88.0000 ug | ORAL_TABLET | Freq: Every day | ORAL | Status: DC

## 2011-10-02 MED ORDER — WARFARIN SODIUM 2 MG PO TABS
2.0000 mg | ORAL_TABLET | Freq: Once | ORAL | Status: AC
  Administered 2011-10-02: 2 mg via ORAL
  Filled 2011-10-02: qty 1

## 2011-10-02 MED ORDER — ENSURE PUDDING PO PUDG: 1.0000 | Freq: Three times a day (TID) | ORAL | Status: DC

## 2011-10-02 NOTE — Progress Notes (Signed)
Subjective: 7 Days Post-Op Procedure(s) (LRB): ARTHROPLASTY BIPOLAR HIP (Right)  Activity level: walked with therapy Diet tolerance:eating soft diet Voiding with or without catheter:voiding Patient reports pain as 1 on 0-10 scale.    Objective: Vital signs in last 24 hours: Temp:  [98.9 F (37.2 C)-99.5 F (37.5 C)] 99.5 F (37.5 C) (12/11 1402) Pulse Rate:  [77-82] 79  (12/11 1402) Resp:  [16-18] 18  (12/11 1402) BP: (148-175)/(66-71) 148/66 mmHg (12/11 1402) SpO2:  [97 %-100 %] 100 % (12/11 1402)  Intake/Output from previous day: 12/10 0701 - 12/11 0700 In: 350 [P.O.:350] Out: 1200 [Urine:1200] Intake/Output this shift: Total I/O In: 360 [P.O.:360] Out: -    Basename 10/02/11 0326 10/01/11 0353 09/30/11 0414  HGB 8.7* 9.1* 6.4*    Basename 10/02/11 0326 10/01/11 0353  WBC 12.4* 11.8*  RBC 2.87* 3.07*  HCT 24.6* 26.2*  PLT 291 224    Basename 10/01/11 0353 09/30/11 0414  NA 129* 132*  K 3.6 4.2  CL 96 100  CO2 27 27  BUN 14 12  CREATININE 0.66 0.69  GLUCOSE 117* 111*  CALCIUM 7.8* 7.7*    Basename 10/02/11 0326 10/01/11 0353  LABPT -- --  INR 1.93* 1.55*    Neurologically intact ABD soft Neurovascular intact Sensation intact distally Intact pulses distally Dorsiflexion/Plantar flexion intact Incision: no drainage No cellulitis present Compartment soft  Assessment/Plan: 7 Days Post-Op Procedure(s) (LRB): ARTHROPLASTY BIPOLAR HIP (Right) Advance diet Plan for discharge tomorrow per Dr. Felipa Eth. Pt has rx for pain med -Norco 5/325. [employer] to come out in 8-9 days either in office or by RN at home. Will need to be seen in office in 1-2 weeks. Will need coumadin for 3 more weeks from now.  Summer [name] R 10/02/2011, 4:11 PM

## 2011-10-02 NOTE — Progress Notes (Signed)
ANTICOAGULATION CONSULT NOTE - Follow Up Consult  Pharmacy Consult for Warfarin Indication: DVT prophylaxis, s/p R Hip hemiarthroplasty  No Known Allergies  Patient Measurements: Height: 5\' 7"  (170.2 cm) Weight: 130 lb (58.968 kg) IBW/kg (Calculated) : 61.6    Vital Signs: Temp: 99.3 F (37.4 C) (12/11 0641) Temp src: Oral (12/11 0641) BP: 175/69 mmHg (12/11 0641) Pulse Rate: 82  (12/11 0641)  Labs:  Basename 10/02/11 0326 10/01/11 0353 09/30/11 0414  HGB 8.7* 9.1* --  HCT 24.6* 26.2* 18.1*  PLT 291 224 192  APTT -- -- --  LABPROT 22.4* 18.9* 17.4*  INR 1.93* 1.55* 1.40  HEPARINUNFRC -- -- --  CREATININE -- 0.66 0.69  CKTOTAL -- -- --  CKMB -- -- --  TROPONINI -- -- --   Estimated Creatinine Clearance: 43.5 ml/min (by C-G formula based on Cr of 0.66).   Medications:  Scheduled:     . feeding supplement  1 Container Oral TID BM  . levothyroxine  88 mcg Oral QAC breakfast  . olmesartan  20 mg Oral Daily  . pantoprazole  40 mg Oral QAC breakfast  . polyethylene glycol  17 g Oral Daily  . senna-docusate  1 tablet Oral BID  . warfarin  5 mg Oral ONCE-1800  . DISCONTD: ceFAZolin (ANCEF) IV  1 g Intravenous Once  . DISCONTD: enoxaparin  30 mg Subcutaneous Q12H   I Warfarin doses administered for past 3 days = 5mg  PO daily.  Infusions:    . sodium chloride 100 mL/hr at 10/02/11 0424   PRN: acetaminophen, acetaminophen, HYDROcodone-acetaminophen, HYDROmorphone, menthol-cetylpyridinium, methocarbamol(ROBAXIN) IV, methocarbamol, ondansetron (ZOFRAN) IV, ondansetron, phenol, DISCONTD: fentaNYL, DISCONTD: fentaNYL, DISCONTD: HYDROmorphone, DISCONTD: metoCLOPramide (REGLAN) injection, DISCONTD: metoCLOPramide  Assessment: -77 yo F on warfain for DVT prophylaxis s/p R hip arthroplasty  -INR responding, and now within target range established by orthopedist.   Goal of Therapy:  INR 1.5-2 per MD order (ortho)   Plan:  -Reduce warfarin to 2mg  PO today. -DC  Lovenox. -Continue daily INRs  Elie Goody, PharmD  [phone] 10/02/2011 9:10 AM

## 2011-10-02 NOTE — Progress Notes (Signed)
Physical Therapy Treatment Patient Details Name: Summer [name] MRN: [id number] DOB: [birth year] Today's Date: 10/02/2011  PT Assessment/Plan  PT - Assessment/Plan Comments on Treatment Session: Pt continues to improve daily, now able to ambulate short distances with RW   PT Plan: Discharge plan remains appropriate PT Frequency: Min 5X/week Follow Up Recommendations: Home health PT PT Goals  Acute Rehab PT Goals PT Goal: Supine/Side to Sit - Progress: Progressing toward goal PT Goal: Sit to Stand - Progress: Progressing toward goal PT Transfer Goal: Bed to Chair/Chair to Bed - Progress: Progressing toward goal  PT Treatment Precautions/Restrictions  Precautions Precautions: Posterior Hip Precaution Booklet Issued: Yes (comment) Precaution Comments: daughter given handout w precautions.  Pt does not understand precautions at this point. Required Braces or Orthoses: Yes Knee Immobilizer: Other (comment) (in bed for hip precautions) Restrictions Weight Bearing Restrictions: Yes RLE Weight Bearing: Weight bearing as tolerated Other Position/Activity Restrictions: ?? will patient need to follow post THR precautions?? Mobility (including Balance) Bed Mobility Supine to Sit: 3: Mod assist Transfers Transfers: Yes Sit to Stand: 3: Mod assist Stand to Sit: 3: Mod assist Ambulation/Gait Ambulation/Gait: Yes Ambulation/Gait Assistance: 3: Mod assist Ambulation/Gait Assistance Details (indicate cue type and reason): pt needs encouragement, cues for sequence and techniqe Ambulation Distance (Feet): 20 Feet Assistive device: Rolling walker Gait Pattern: Step-to pattern;Decreased step length - right;Decreased step length - left;Decreased weight shift to right Gait velocity: slow  Posture/Postural Control Posture/Postural Control: Postural limitations Postural Limitations: Pt forward flexed with foward head and rounded shoulders throughout session. Exercise    End of Session PT -  End of Session Equipment Utilized During Treatment: Gait belt Activity Tolerance: Patient limited by pain;Patient limited by fatigue Patient left: in chair;with family/visitor present General Behavior During Session: [facility] for tasks performed Cognition: [facility] for tasks performed Cognitive Impairment: pt pleasant, cooperative, needs encouragement  Donnetta Hail 10/02/2011, 3:56 PM

## 2011-10-02 NOTE — Progress Notes (Signed)
Subjective: Patient resting comfortably, no complaints, pain well-controlled, no shortness of breath, chest pain, nausea, vomiting, eating well as long as standby daughter, who is present. No distress. Working with physical therapy. Bowel regimen good. Denies any Chills. Memory possibly better, currently only using hydrocodone without Dilaudid. Review of systems as above.  Objective: Vital signs in last 24 hours: Temp:  [98.9 F (37.2 C)-99.5 F (37.5 C)] 99.5 F (37.5 C) (12/11 1402) Pulse Rate:  [77-82] 79  (12/11 1402) Resp:  [16-18] 18  (12/11 1402) BP: (148-175)/(66-71) 148/66 mmHg (12/11 1402) SpO2:  [97 %-100 %] 100 % (12/11 1402) Weight change:  Last BM Date: 10/01/11  CBG (last 3)  No results found for this basename: GLUCAP:3 in the last 72 hours  Intake/Output from previous day: 12/10 0701 - 12/11 0700 In: 350 [P.O.:350] Out: 1200 [Urine:1200] Intake/Output this shift:   No apparent distress, verbal, answering all questions appropriately, no slurred speech Anicteric, no oropharyngeal lesions, lungs clear to auscultation bilaterally, cardiovascular reveals regular rate and rhythm, abdomen soft, nontender, nondistended, bowel sounds present, SCDs in place, knee immobilizer in place, no edema, pedal pulses intact but slightly diminished.     Lab Results:  [facility] 10/01/11 0353 09/30/11 0414  NA 129* 132*  K 3.6 4.2  CL 96 100  CO2 27 27  GLUCOSE 117* 111*  BUN 14 12  CREATININE 0.66 0.69  CALCIUM 7.8* 7.7*  MG -- --  PHOS -- --    Basename 09/30/11 0414  AST 31  ALT 14  ALKPHOS 64  BILITOT 0.6  PROT 5.4*  ALBUMIN 1.9*    Basename 10/02/11 0326 10/01/11 0353  WBC 12.4* 11.8*  NEUTROABS -- --  HGB 8.7* 9.1*  HCT 24.6* 26.2*  MCV 85.7 85.3  PLT 291 224   No results found for this basename: CKTOTAL:3,CKMB:3,CKMBINDEX:3,TROPONINI:3 in the last 72 hours No results found for this basename: TSH,T4TOTAL,FREET3,T3FREE,THYROIDAB in the last 72 hours No  results found for this basename: VITAMINB12:2,FOLATE:2,FERRITIN:2,TIBC:2,IRON:2,RETICCTPCT:2 in the last 72 hours  Studies/Results: No results found.   Medications: Scheduled:   . feeding supplement  1 Container Oral TID BM  . levothyroxine  88 mcg Oral QAC breakfast  . olmesartan  20 mg Oral Daily  . pantoprazole  40 mg Oral QAC breakfast  . polyethylene glycol  17 g Oral Daily  . senna-docusate  1 tablet Oral BID  . warfarin  2 mg Oral ONCE-1800  . DISCONTD: enoxaparin  30 mg Subcutaneous Q12H   Continuous:   . sodium chloride 100 mL/hr at 10/02/11 1304    Assessment/Plan: Active Problems:  Fracture of femoral neck, right Fracture of femoral neck, right  continues to work with physical and occupational therapy, requiring 2+ assist along with [employer]on lift, daughter has arranged home health along with physical and occupational therapy, outside help as needed, ramp to be built, extra help coming from family members coming home for the holidays, daughter and patient adamantly opposed to going to skilled nursing facility. Discussed the risk involved, and the need for adequate help. Patient's daughter states that she will obtain outside help at additional expense if required. Weightbearing as tolerated with assistance . Home health ordered yesterday DVT prophylaxis-currently on Coumadin, therapeutic, Lovenox discontinued, will continue for a total of 3 weeks per orthopedics. Recheck CBC in the morning Anemia secondary to blood loss during surgery, question need for additional iron supplementation, will check iron levels in the morning, along with TIBC, will recheck CBC for stability, patient previously on proton  pump inhibitor at home, will restart for protection given now anticoagulation needs.  Hypertension-controlled now but slightly high we'll continue to monitor on home dose of DiovanHistory of hypothyroidism currently on supplementation at home, will restart at previous dose, TSH  surely after admission normal  Dementia-mild, history of using Aricept, stopped by patient, may need to reinitiate 1 stable at home, complicated by hospital setting, use of pain medications, hopefully will improve at home and normal setting.  Altered mental status-secondary to postoperative influences, now much improved, we'll minimize pain medications as tolerated, head CT unremarkable.  Disposition-hopefully home tomorrow with home health physical therapy, occupational therapy, RN, monitoring of Coumadin, will need TED stockings, close follow up with orthopedics, followup for anemia    LOS: 7 days   Malerie Eakins R 10/02/2011, 7:41 PM

## 2011-10-03 LAB — CBC
HCT: 26.9 % — ABNORMAL LOW (ref 36.0–46.0)
MCHC: 34.6 g/dL (ref 30.0–36.0)
RDW: 15.1 % (ref 11.5–15.5)
WBC: 11.7 10*3/uL — ABNORMAL HIGH (ref 4.0–10.5)

## 2011-10-03 LAB — PROTIME-INR
INR: 1.73 — ABNORMAL HIGH (ref 0.00–1.49)
Prothrombin Time: 20.6 seconds — ABNORMAL HIGH (ref 11.6–15.2)

## 2011-10-03 MED ORDER — WARFARIN SODIUM 2 MG PO TABS
2.0000 mg | ORAL_TABLET | Freq: Once | ORAL | Status: DC
  Filled 2011-10-03: qty 1

## 2011-10-03 MED ORDER — WARFARIN SODIUM 2.5 MG PO TABS: 2.5000 mg | ORAL_TABLET | Freq: Every day | ORAL | Status: DC

## 2011-10-03 NOTE — Progress Notes (Signed)
Subjective: 8 Days Post-Op Procedure(s) (LRB): ARTHROPLASTY BIPOLAR HIP (Right)  Activity level: OOB to chair Diet tolerance:regular Voiding with or without catheter:yes Patient reports pain as mild.    Objective: Vital signs in last 24 hours: Temp:  [98.3 F (36.8 C)-99.5 F (37.5 C)] 98.3 F (36.8 C) (12/12 0400) Pulse Rate:  [70-80] 70  (12/12 0400) Resp:  [16-20] 20  (12/12 0400) BP: (148-175)/(66-77) 175/77 mmHg (12/12 0400) SpO2:  [97 %-100 %] 98 % (12/12 0400)  Intake/Output from previous day: 12/11 0701 - 12/12 0700 In: 1360 [P.O.:1360] Out: 1875 [Urine:1875] Intake/Output this shift:     Basename 10/03/11 0355 10/02/11 0326 10/01/11 0353  HGB 9.3* 8.7* 9.1*    Basename 10/03/11 0355 10/02/11 0326  WBC 11.7* 12.4*  RBC 3.09* 2.87*  HCT 26.9* 24.6*  PLT 366 291    Basename 10/01/11 0353  NA 129*  K 3.6  CL 96  CO2 27  BUN 14  CREATININE 0.66  GLUCOSE 117*  CALCIUM 7.8*    Basename 10/03/11 0355 10/02/11 0326  LABPT -- --  INR 1.73* 1.93*    Neurologically intact ABD soft Neurovascular intact Sensation intact distally Intact pulses distally Dorsiflexion/Plantar flexion intact Incision: no drainage No cellulitis present Compartment soft  Assessment/Plan: 8 Days Post-Op Procedure(s) (LRB): ARTHROPLASTY BIPOLAR HIP (Right) Up with therapy D/C IV fluids Discharge home with home health Follow up with [place] in a week Appreciate help of Dr Felipa Eth  Velna Ochs 10/03/2011, 7:22 AM

## 2011-10-03 NOTE — Progress Notes (Signed)
10/03/2011 Raynelle Bring BSN CCM [phone] Pt for d/c today. Additional orders for [facility] for blood drws for CBC, PT, INR's; DME-hosp bed and hoyer lift have been delivered to patient's home. Advanced aware of discharge   09/30/11 Tymeeka Davis,RN,BSN CM spoke with family concerning d/c plan. Plan remains for pt to return home with daughter. Per daughter equipment delivered to residence on 12/8. Awaiting MD orders for HHPT/OT/HHA upon discharge. Elenore Paddy [phone]  09/28/2011, Elon Jester RNC_MNN, BSN, Orders received as above in addition to hoyer llift.  Services arranged.  Will follow.  09/27/2011, Kathi Der RNC-MNN< BSN, [phone], CM spoke with pt's daughter and reviewed MD/PT/OT notes.  Plan is undetermined at this point.  Daughter would like to take pt home if possible with home health services.  Daughter is also willing to consider SNF if that is the recommendation per notes.  Will follow.  Pt's daughter states they have used [facility] before and would like to use them again if [facility] services are needed. 09/26/2011 Raynelle Bring BSN CCM [phone] CM notified by CSW that she had discussed d/c plans with patient's daughter via telephone. Plans are that the patient will go home with daughter as caregiver.

## 2011-10-03 NOTE — Progress Notes (Signed)
ANTICOAGULATION CONSULT NOTE - Follow Up Consult  Pharmacy Consult for Warfarin Indication: DVT prophylaxis, s/p R Hip hemiarthroplasty  No Known Allergies  Patient Measurements: Height: 5\' 7"  (170.2 cm) Weight: 130 lb (58.968 kg) IBW/kg (Calculated) : 61.6    Vital Signs: Temp: 98.3 F (36.8 C) (12/12 0400) Temp src: Oral (12/12 0400) BP: 175/77 mmHg (12/12 0400) Pulse Rate: 70  (12/12 0400)  Labs:  Basename 10/03/11 0355 10/02/11 0326 10/01/11 0353  HGB 9.3* 8.7* --  HCT 26.9* 24.6* 26.2*  PLT 366 291 224  APTT -- -- --  LABPROT 20.6* 22.4* 18.9*  INR 1.73* 1.93* 1.55*  HEPARINUNFRC -- -- --  CREATININE -- -- 0.66  CKTOTAL -- -- --  CKMB -- -- --  TROPONINI -- -- --   Estimated Creatinine Clearance: 43.5 ml/min (by C-G formula based on Cr of 0.66).   Medications:  Scheduled:     . feeding supplement  1 Container Oral TID BM  . levothyroxine  88 mcg Oral QAC breakfast  . olmesartan  20 mg Oral Daily  . pantoprazole  40 mg Oral QAC breakfast  . polyethylene glycol  17 g Oral Daily  . senna-docusate  1 tablet Oral BID  . warfarin  2 mg Oral ONCE-1800   I Warfarin doses administered for past 3 days = 5mg  PO daily.  Infusions:     . sodium chloride 100 mL/hr at 10/02/11 2236   PRN: acetaminophen, acetaminophen, HYDROcodone-acetaminophen, HYDROmorphone, menthol-cetylpyridinium, methocarbamol(ROBAXIN) IV, methocarbamol, ondansetron (ZOFRAN) IV, ondansetron, phenol  Assessment: -77 yo F on warfain for DVT prophylaxis s/p R hip arthroplasty  -INR responding, and now within target range established by orthopedist.   Goal of Therapy:  INR 1.5-2 per MD order (ortho)   Plan:  - Cont with warfarin to 2mg  PO today. - f/u daily INRs  Charolotte Eke, PharmD, pager [phone]. 10/03/2011,10:14 AM.

## 2011-10-03 NOTE — Progress Notes (Signed)
CSW reviewed chart, plan is for Pt to go home with home health. CSW signing off, please re-consult if needed.  Vennie Homans 10/03/2011 8:53 AM For Ladene Artist, Theresia Majors [phone]

## 2011-10-03 NOTE — Discharge Planning (Signed)
DISCHARGE SUMMARY  Summer [name]  MR#: [id number]  DOB:05-Sep-1919  Date of Admission: 09/25/2011 Date of Discharge: 10/03/2011  Attending Physician:Kumiko Fishman R  Patient's UXL:KGMW,NUUVOZDGUY R, MD, MD  Consults: Dr Marcene Corning  Discharge Diagnoses: Active Problems:  Fracture of femoral neck, right  Hypertension Hyperlipidemia Altered mental status resolved postoperatively Dementia Anemia secondary to acute blood loss Discharge Medications: Current Discharge Medication List    START taking these medications   Details  feeding supplement (ENSURE) PUDG Take 1 Container by mouth 3 (three) times daily between meals. Qty: 90 Can, Refills: 12    HYDROcodone-acetaminophen (NORCO) 5-325 MG per tablet Take 1-2 tablets by mouth every 4 (four) hours as needed. Qty: 30 tablet, Refills: 1    !! levothyroxine (SYNTHROID, LEVOTHROID) 88 MCG tablet Take 1 tablet (88 mcg total) by mouth daily before breakfast. Qty: 30 tablet, Refills: 6    polyethylene glycol (MIRALAX / GLYCOLAX) packet Take 17 g by mouth daily. Qty: 30 each, Refills: 2    warfarin (COUMADIN) 2.5 MG tablet Take 1 tablet (2.5 mg total) by mouth daily. Qty: 30 tablet, Refills: 0     !! - Potential duplicate medications found. Please discuss with provider.    CONTINUE these medications which have NOT CHANGED   Details  alendronate (FOSAMAX) 10 MG tablet Take 70 mg by mouth once a week. Take with a full glass of water on an empty stomach.     aspirin 81 MG tablet Take 81 mg by mouth daily.      fexofenadine (ALLEGRA) 180 MG tablet Take 180 mg by mouth daily.      !! levothyroxine (SYNTHROID, LEVOTHROID) 88 MCG tablet Take 88 mcg by mouth daily.      Multiple Vitamin (MULTIVITAMIN) capsule Take 1 capsule by mouth daily.      omeprazole (PRILOSEC) 20 MG capsule Take 20 mg by mouth 2 (two) times daily.     simvastatin (ZOCOR) 20 MG tablet Take 20 mg by mouth at bedtime.      valsartan (DIOVAN) 320 MG  tablet Take 320 mg by mouth daily.       !! - Potential duplicate medications found. Please discuss with provider.    STOP taking these medications     traMADol (ULTRAM) 50 MG tablet         Hospital Procedures: Dg Hip Complete Right Transfusion of 4 units of packed red blood cells  09/25/2011  *RADIOLOGY REPORT*  Clinical Data: Status post fall; right hip pain.  RIGHT HIP - COMPLETE 2+ VIEW  Comparison: None.  Findings: There is a slightly comminuted subcapital fracture through the right femoral neck, with superior displacement of the distal femur.  The right femoral head remains seated at the acetabulum.  There is mild axial joint space narrowing, with associated sclerotic change.  More minimal degenerative change is noted at the left hip.  Mild sclerosis is noted at the sacroiliac joints.  Mild degenerative change is noted at the lower lumbar spine.  The visualized bowel gas pattern is grossly unremarkable in appearance.  IMPRESSION: Slightly comminuted subcapital fracture through the right femoral neck, with superior displacement of the distal femur.  Mild degenerative change noted at the right hip joint.  Original Report Authenticated By: Tonia Ghent, M.D.   Ct Head Wo Contrast  09/29/2011  *RADIOLOGY REPORT*  Clinical Data: Postoperative confusion.  Status post right hip surgery.  Visual deficits.  CT HEAD WITHOUT CONTRAST  Technique:  Contiguous axial images were obtained from the base of the  skull through the vertex without contrast.  Comparison: CT head without contrast 02/23/2011 at [facility]  Findings: No acute cortical infarct, hemorrhage, mass lesion is present.  Moderate periventricular and subcortical confluent white matter hypoattenuation is present.  Mild generalized atrophy is stable.  The ventricles are proportionate to the degree of atrophy. No significant extra-axial fluid collection is present.  The wall thickening about the sphenoid sinuses suggests chronic  disease.  No mucosal thickening or fluid levels are present.  The paranasal sinuses are otherwise clear.  The mastoid air cells are clear.  The osseous skull is intact.  Atherosclerotic calcifications are evident within the cavernous carotid arteries.  IMPRESSION:  1.  No acute intracranial abnormality or significant interval change. 2.  Stable atrophy and diffuse white matter disease.  This likely reflects the sequelae of chronic microvascular ischemia. 3.  Atherosclerosis.  Original Report Authenticated By: Jamesetta Orleans. MATTERN, M.D.   Dg Chest Port 1 View  09/25/2011  *RADIOLOGY REPORT*  Clinical Data: Fall, right hip pain  PORTABLE CHEST - 1 VIEW  Comparison: 02/23/2011  Findings: Cardiomediastinal silhouette is stable.  No acute infiltrate or pleural effusion.  No pulmonary edema.  IMPRESSION: No active disease.  Original Report Authenticated By: Natasha Mead, M.D.    History of Present Illness: 77 year old female who fell at home without loss of consciousness with a history of hypertension, arthritis, hyperlipidemia, hypothyroidism, history of TIA and constipation. Patient presented to the emergency room where she was admitted by orthopedics and underwent complete right hip arthroplasty  Hospital Course: Patient did well initially however was consultative I altered mental status, confusion, well above her baseline of mild dementia. Head CT unremarkable, labs remarkable for some mild leukocytosis but no evidence of pulmonary disease or infection, urine tract infection, wound infection. Patient's course was also palpated by hyponatremia, she was treated with normal saline IV fluids. Over the next 2-3 days with supportive therapy, patient resolved with mild dementia and short-term memory loss. Closer baseline. Remained appropriate following commands. Had significant support by her daughter, extensive discussion was undertaken between the daughter, patient as well as orthopedics as well as myself with the  ultimate result being that the family has refused skilled nursing facility placement and desires transferred home with significant help to include home health physical therapy, occupational therapy, RN support along with nurse aide. She will continue on 3 weeks of anticoagulation with Coumadin now that she has remained on subcutaneous Lovenox as a bridge pending therapeutic PT/INR to remain greater than 1.7-1.8 and less than 2.2 worked at least 3.0. Orthopedics has been following the patient on a regular basis and will see the patient in approximately one week for staple removal and wound assessment. Patient did continue to work with physical and occupational therapy and did require a 2+ assist along with a Hoyer lift for mobility and transfers. This will continue at home. Patient's daughter does have a hospital bed in place along with [facility] lift, and has plans to build a ramp for access to the house. She does plan on hiring additional help in addition to family members to help with supporting the patient.  Day of Discharge Exam BP 175/77  Pulse 70  Temp(Src) 98.3 F (36.8 C) (Oral)  Resp 20  Ht 5\' 7"  (1.702 m)  Wt 58.968 kg (130 lb)  BMI 20.36 kg/m2  SpO2 98%  Physical Exam: General appearance: alert, cooperative and no distress Eyes: no scleral icterus Throat: oropharynx moist without erythema Resp: clear to auscultation  bilaterally Cardio: regular rate and rhythm GI: soft, non-tender; bowel sounds normal; no masses,  no organomegaly Extremities: no clubbing, cyanosis or edema  Discharge Labs:  [facility] 10/01/11 0353  NA 129*  K 3.6  CL 96  CO2 27  GLUCOSE 117*  BUN 14  CREATININE 0.66  CALCIUM 7.8*  MG --  PHOS --   No results found for this basename: AST:2,ALT:2,ALKPHOS:2,BILITOT:2,PROT:2,ALBUMIN:2 in the last 72 hours  Basename 10/03/11 0355 10/02/11 0326  WBC 11.7* 12.4*  NEUTROABS -- --  HGB 9.3* 8.7*  HCT 26.9* 24.6*  MCV 87.1 85.7  PLT 366 291   Lab Results    Component Value Date   INR 1.73* 10/03/2011   INR 1.93* 10/02/2011   INR 1.55* 10/01/2011   No results found for this basename: CKTOTAL:3,CKMB:3,CKMBINDEX:3,TROPONINI:3 in the last 72 hours No results found for this basename: TSH,T4TOTAL,FREET3,T3FREE,THYROIDAB in the last 72 hours No results found for this basename: VITAMINB12:2,FOLATE:2,FERRITIN:2,TIBC:2,IRON:2,RETICCTPCT:2 in the last 72 hours  Discharge instructions: Discharge Orders    Future Orders Please Complete By Expires   Diet - low sodium heart healthy      Increase activity slowly      Comments:   Patient to work with physical therapy and occupational therapy, out of bed with assistance only   Call MD for:  temperature >100.4      Call MD for:  persistant nausea and vomiting      Call MD for:  severe uncontrolled pain      Call MD for:  redness, tenderness, or signs of infection (pain, swelling, redness, odor or green/yellow discharge around incision site)      Change dressing (specify)      Comments:   Dressing change: Per orthopedics and home health nurse, staples will need to be removed by orthopedics with office visit in approximately one week.   Discharge instructions      Comments:   Physical therapy and occupational therapy to be set up by home health along with RN to assist with medication administration, following PT/INR to keep therapeutic between 1.7 and 2.2 for approximately 3 weeks and then discontinue.      Disposition: She'll be discharged home, along with help of home health for PT, OT, RN, and nursing aide. Patient with the assistance for medication administration, followup on bowel regimen, regulation and adjustment of Coumadin based on PT/INR levels. Patient will need a PT/INR in approximately one week. Follow-up Appts: Follow-up with Dr. Felipa Eth  at [facility] in 2 weeks.  Call for appointment.  Condition on Discharge:stable  Tests Needing Follow-up: PT/INR and CBC in approximately  one week with home health  SignedHoyle Sauer 10/03/2011, 7:32 AM Dr

## 2011-10-03 NOTE — Progress Notes (Signed)
DC to home. To home per PTAR. No change from am assessment. Belongings taken w/daughter.

## 2012-08-12 ENCOUNTER — Emergency Department (HOSPITAL_COMMUNITY): Payer: Medicare Other

## 2012-08-12 ENCOUNTER — Emergency Department (HOSPITAL_COMMUNITY)
Admission: EM | Admit: 2012-08-12 | Discharge: 2012-08-12 | Disposition: A | Discharge status: Home / Self Care | Payer: Medicare Other | Attending: Emergency Medicine | Admitting: Emergency Medicine

## 2012-08-12 ENCOUNTER — Encounter (HOSPITAL_COMMUNITY): Payer: Self-pay | Admitting: *Deleted

## 2012-08-12 DIAGNOSIS — K5900 Constipation, unspecified: Secondary | ICD-10-CM | POA: Insufficient documentation

## 2012-08-12 DIAGNOSIS — R4182 Altered mental status, unspecified: Secondary | ICD-10-CM | POA: Insufficient documentation

## 2012-08-12 DIAGNOSIS — Z8673 Personal history of transient ischemic attack (TIA), and cerebral infarction without residual deficits: Secondary | ICD-10-CM | POA: Insufficient documentation

## 2012-08-12 DIAGNOSIS — F0280 Dementia in other diseases classified elsewhere without behavioral disturbance: Secondary | ICD-10-CM | POA: Insufficient documentation

## 2012-08-12 DIAGNOSIS — R61 Generalized hyperhidrosis: Secondary | ICD-10-CM

## 2012-08-12 DIAGNOSIS — N390 Urinary tract infection, site not specified: Secondary | ICD-10-CM

## 2012-08-12 DIAGNOSIS — E7800 Pure hypercholesterolemia, unspecified: Secondary | ICD-10-CM | POA: Insufficient documentation

## 2012-08-12 DIAGNOSIS — Z79899 Other long term (current) drug therapy: Secondary | ICD-10-CM | POA: Insufficient documentation

## 2012-08-12 DIAGNOSIS — I10 Essential (primary) hypertension: Secondary | ICD-10-CM | POA: Insufficient documentation

## 2012-08-12 DIAGNOSIS — Z7982 Long term (current) use of aspirin: Secondary | ICD-10-CM | POA: Insufficient documentation

## 2012-08-12 DIAGNOSIS — E079 Disorder of thyroid, unspecified: Secondary | ICD-10-CM | POA: Insufficient documentation

## 2012-08-12 DIAGNOSIS — Z96649 Presence of unspecified artificial hip joint: Secondary | ICD-10-CM | POA: Insufficient documentation

## 2012-08-12 DIAGNOSIS — M129 Arthropathy, unspecified: Secondary | ICD-10-CM | POA: Insufficient documentation

## 2012-08-12 HISTORY — DX: Unspecified dementia, unspecified severity, without behavioral disturbance, psychotic disturbance, mood disturbance, and anxiety: F03.90

## 2012-08-12 HISTORY — DX: Generalized hyperhidrosis: R61

## 2012-08-12 LAB — URINALYSIS, ROUTINE W REFLEX MICROSCOPIC
Bilirubin Urine: NEGATIVE
Hgb urine dipstick: NEGATIVE
Specific Gravity, Urine: 1.024 (ref 1.005–1.030)
Urobilinogen, UA: 1 mg/dL (ref 0.0–1.0)
pH: 6 (ref 5.0–8.0)

## 2012-08-12 LAB — CBC WITH DIFFERENTIAL/PLATELET
Basophils Relative: 0 % (ref 0–1)
HCT: 34.4 % — ABNORMAL LOW (ref 36.0–46.0)
Hemoglobin: 12 g/dL (ref 12.0–15.0)
Lymphocytes Relative: 10 % — ABNORMAL LOW (ref 12–46)
Lymphs Abs: 1.1 10*3/uL (ref 0.7–4.0)
MCHC: 34.9 g/dL (ref 30.0–36.0)
Monocytes Absolute: 1 10*3/uL (ref 0.1–1.0)
Monocytes Relative: 9 % (ref 3–12)
Neutro Abs: 9.4 10*3/uL — ABNORMAL HIGH (ref 1.7–7.7)
Neutrophils Relative %: 82 % — ABNORMAL HIGH (ref 43–77)
RBC: 3.81 MIL/uL — ABNORMAL LOW (ref 3.87–5.11)
WBC: 11.5 10*3/uL — ABNORMAL HIGH (ref 4.0–10.5)

## 2012-08-12 LAB — RAPID URINE DRUG SCREEN, HOSP PERFORMED
Amphetamines: NOT DETECTED
Benzodiazepines: POSITIVE — AB
Cocaine: NOT DETECTED
Opiates: NOT DETECTED
Tetrahydrocannabinol: NOT DETECTED

## 2012-08-12 LAB — BASIC METABOLIC PANEL
BUN: 25 mg/dL — ABNORMAL HIGH (ref 6–23)
Chloride: 99 mEq/L (ref 96–112)
GFR calc Af Amer: 72 mL/min — ABNORMAL LOW (ref >90)
GFR calc non Af Amer: 63 mL/min — ABNORMAL LOW (ref >90)
Potassium: 4.3 mEq/L (ref 3.5–5.1)
Sodium: 134 mEq/L — ABNORMAL LOW (ref 135–145)

## 2012-08-12 LAB — URINE MICROSCOPIC-ADD ON

## 2012-08-12 MED ORDER — DEXTROSE 5 % IV SOLN
1.0000 g | Freq: Once | INTRAVENOUS | Status: AC
  Administered 2012-08-12: 1 g via INTRAVENOUS
  Filled 2012-08-12: qty 10

## 2012-08-12 MED ORDER — LORAZEPAM 0.5 MG PO TABS
0.5000 mg | ORAL_TABLET | Freq: Once | ORAL | Status: AC
  Administered 2012-08-12: 0.5 mg via ORAL
  Filled 2012-08-12: qty 1

## 2012-08-12 MED ORDER — QUETIAPINE FUMARATE 25 MG PO TABS: 25.0000 mg | ORAL_TABLET | Freq: Every day | ORAL | Status: DC

## 2012-08-12 MED ORDER — QUETIAPINE FUMARATE 25 MG PO TABS
25.0000 mg | ORAL_TABLET | ORAL | Status: AC
  Administered 2012-08-12: 25 mg via ORAL
  Filled 2012-08-12: qty 1

## 2012-08-12 MED ORDER — SODIUM CHLORIDE 0.9 % IV SOLN
INTRAVENOUS | Status: DC
  Administered 2012-08-12: 1000 mL via INTRAVENOUS
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
## 2012-08-12 NOTE — ED Notes (Signed)
PIV removed from right antecubital. Applied heat pack to site and elevated arm to heart level.
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
## 2012-08-12 NOTE — Progress Notes (Signed)
Per discussion with RN CM, pt was set up with Home Health services including a social worker to assist with pt needs of placement from home if high level of care is needed. Per discussion with pt MD, pt is medically and psychiatrically cleared at this time. .No further Clinical Social Work needs, signing off.   Doree Albee  [phone] 08/12/2012 9:54pm

## 2012-08-12 NOTE — ED Notes (Signed)
telepsych being completed at this time.  
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
## 2012-08-12 NOTE — ED Notes (Signed)
Pt from home. Lives with daughter. Daughter called EMS secondary to pt behavior more erratic and increased confusion secondary to dementia. Pt alert and oriented x 3. Neuro at baseline. VS: 158/80, pulse 84, RR 16, O2 sats 94 on RA.

## 2012-08-12 NOTE — Progress Notes (Signed)
Reviewed case information with EDP, Mcmanus, Dr Mahala Menghini and ED SW
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
## 2012-08-12 NOTE — ED Notes (Signed)
Daughter at the bedside. [employer] at bedside serving IVC papers to pt.

## 2012-08-12 NOTE — Progress Notes (Signed)
CSW received referral for potential disposition needs from ED Rn case Production designer, theatre/television/film. Pt pending consult to evaluate for admission and possible telepsych.   Catha Gosselin, LCSWA  [phone] .08/12/2012 1702pm

## 2012-08-12 NOTE — ED Notes (Signed)
Paged Selena Batten, Case Manager for assistance with long term pt placement. Left voice mail.
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
## 2012-08-12 NOTE — ED Notes (Signed)
Spoke with Almira Coaster, Child psychotherapist, regarding long term placement for the pt.

## 2012-08-12 NOTE — ED Notes (Signed)
RUE:[id number]<JW> Expected date:<BR> Expected time:<BR> Means of arrival:Ambulance<BR> Comments:<BR> 78yo-dementia

## 2012-08-12 NOTE — Progress Notes (Signed)
[facility] ED Cm spoke with Dr Clarene Duke and reviewed EPIC labs/imaging Noting abnormal labs Urinalysis WBC 11-20 U Bacteria few Mucous present, leukocytes small --drug screen positive for Benzodiazepines--BUN 25, creat 0.8, wbc 11.5 Pt to be reviewed/evaluated for admission

## 2012-08-13 LAB — URINE CULTURE

## 2012-09-06 ENCOUNTER — Emergency Department (HOSPITAL_COMMUNITY)
Admission: EM | Admit: 2012-09-06 | Discharge: 2012-09-06 | Disposition: A | Discharge status: Home / Self Care | Payer: Medicare Other | Attending: Emergency Medicine | Admitting: Emergency Medicine

## 2012-09-06 ENCOUNTER — Encounter (HOSPITAL_COMMUNITY): Payer: Self-pay

## 2012-09-06 DIAGNOSIS — M129 Arthropathy, unspecified: Secondary | ICD-10-CM | POA: Insufficient documentation

## 2012-09-06 DIAGNOSIS — Z7982 Long term (current) use of aspirin: Secondary | ICD-10-CM | POA: Insufficient documentation

## 2012-09-06 DIAGNOSIS — Z862 Personal history of diseases of the blood and blood-forming organs and certain disorders involving the immune mechanism: Secondary | ICD-10-CM | POA: Insufficient documentation

## 2012-09-06 DIAGNOSIS — Z79899 Other long term (current) drug therapy: Secondary | ICD-10-CM | POA: Insufficient documentation

## 2012-09-06 DIAGNOSIS — IMO0002 Reserved for concepts with insufficient information to code with codable children: Secondary | ICD-10-CM | POA: Insufficient documentation

## 2012-09-06 DIAGNOSIS — R451 Restlessness and agitation: Secondary | ICD-10-CM

## 2012-09-06 DIAGNOSIS — Z8673 Personal history of transient ischemic attack (TIA), and cerebral infarction without residual deficits: Secondary | ICD-10-CM | POA: Insufficient documentation

## 2012-09-06 DIAGNOSIS — F0390 Unspecified dementia without behavioral disturbance: Secondary | ICD-10-CM | POA: Insufficient documentation

## 2012-09-06 DIAGNOSIS — E079 Disorder of thyroid, unspecified: Secondary | ICD-10-CM | POA: Insufficient documentation

## 2012-09-06 DIAGNOSIS — I10 Essential (primary) hypertension: Secondary | ICD-10-CM | POA: Insufficient documentation

## 2012-09-06 DIAGNOSIS — E7800 Pure hypercholesterolemia, unspecified: Secondary | ICD-10-CM | POA: Insufficient documentation

## 2012-09-06 HISTORY — DX: Anemia, unspecified: D64.9

## 2012-09-06 HISTORY — DX: Unspecified osteoarthritis, unspecified site: M19.90

## 2012-09-06 LAB — CBC WITH DIFFERENTIAL/PLATELET
Basophils Absolute: 0 10*3/uL (ref 0.0–0.1)
Basophils Relative: 0 % (ref 0–1)
Eosinophils Absolute: 0.1 10*3/uL (ref 0.0–0.7)
Eosinophils Relative: 2 % (ref 0–5)
HCT: 35.6 % — ABNORMAL LOW (ref 36.0–46.0)
Hemoglobin: 12.3 g/dL (ref 12.0–15.0)
Lymphocytes Relative: 40 % (ref 12–46)
Lymphs Abs: 2.2 10*3/uL (ref 0.7–4.0)
MCH: 31.4 pg (ref 26.0–34.0)
MCHC: 34.6 g/dL (ref 30.0–36.0)
MCV: 90.8 fL (ref 78.0–100.0)
Monocytes Absolute: 0.6 10*3/uL (ref 0.1–1.0)
Monocytes Relative: 10 % (ref 3–12)
Neutro Abs: 2.6 10*3/uL (ref 1.7–7.7)
Neutrophils Relative %: 48 % (ref 43–77)
Platelets: 137 10*3/uL — ABNORMAL LOW (ref 150–400)
RBC: 3.92 MIL/uL (ref 3.87–5.11)
RDW: 13.4 % (ref 11.5–15.5)
WBC: 5.5 10*3/uL (ref 4.0–10.5)

## 2012-09-06 LAB — COMPREHENSIVE METABOLIC PANEL
ALT: 10 U/L (ref 0–35)
AST: 21 U/L (ref 0–37)
Albumin: 3.7 g/dL (ref 3.5–5.2)
Alkaline Phosphatase: 119 U/L — ABNORMAL HIGH (ref 39–117)
BUN: 13 mg/dL (ref 6–23)
CO2: 26 mEq/L (ref 19–32)
Calcium: 9.2 mg/dL (ref 8.4–10.5)
Chloride: 100 mEq/L (ref 96–112)
Creatinine, Ser: 0.85 mg/dL (ref 0.50–1.10)
GFR calc Af Amer: 67 mL/min — ABNORMAL LOW (ref >90)
GFR calc non Af Amer: 58 mL/min — ABNORMAL LOW (ref >90)
Glucose, Bld: 87 mg/dL (ref 70–99)
Potassium: 4.3 mEq/L (ref 3.5–5.1)
Sodium: 135 mEq/L (ref 135–145)
Total Bilirubin: 0.2 mg/dL — ABNORMAL LOW (ref 0.3–1.2)
Total Protein: 7.6 g/dL (ref 6.0–8.3)

## 2012-09-06 LAB — URINALYSIS, ROUTINE W REFLEX MICROSCOPIC
Glucose, UA: NEGATIVE mg/dL
Leukocytes, UA: NEGATIVE
Protein, ur: NEGATIVE mg/dL
Specific Gravity, Urine: 1.011 (ref 1.005–1.030)
pH: 6.5 (ref 5.0–8.0)

## 2012-09-06 LAB — RAPID URINE DRUG SCREEN, HOSP PERFORMED
Amphetamines: NOT DETECTED
Tetrahydrocannabinol: NOT DETECTED

## 2012-09-06 LAB — LACTIC ACID, PLASMA: Lactic Acid, Venous: 1 mmol/L (ref 0.5–2.2)

## 2012-09-06 LAB — ETHANOL: Alcohol, Ethyl (B): <11 mg/dL (ref 0–11)

## 2012-09-06 MED ORDER — QUETIAPINE FUMARATE 25 MG PO TABS: 50.0000 mg | ORAL_TABLET | Freq: Two times a day (BID) | ORAL | Status: AC

## 2012-09-06 MED ORDER — QUETIAPINE FUMARATE 25 MG PO TABS
25.0000 mg | ORAL_TABLET | Freq: Once | ORAL | Status: AC
  Administered 2012-09-06: 25 mg via ORAL
  Filled 2012-09-06: qty 1

## 2012-09-06 NOTE — ED Notes (Addendum)
[employer] at bedside 

## 2012-09-06 NOTE — ED Notes (Signed)
SN allowed pt dtr and son to come to pt room with caveat that if pt became aggressive or agitated that SN would have to ask family to leave, pt family verbalizes understanding.
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
## 2012-09-06 NOTE — ED Notes (Signed)
QIO:[id number]<BM> Expected date:09/06/12<BR> Expected time: 2:17 PM<BR> Means of arrival:Ambulance<BR> Comments:<BR> AMS

## 2012-09-06 NOTE — ED Notes (Signed)
Offer patient a snack but she stated that she was ok and did not need anything

## 2013-01-04 IMAGING — CR DG HIP COMPLETE 2+V*R*
3 series · 3 of 3 positions shown · non-contrast
Comparison: None.

CLINICAL DATA: Status post fall; right hip pain.

RIGHT HIP - COMPLETE 2+ VIEW

[w hip lat right]
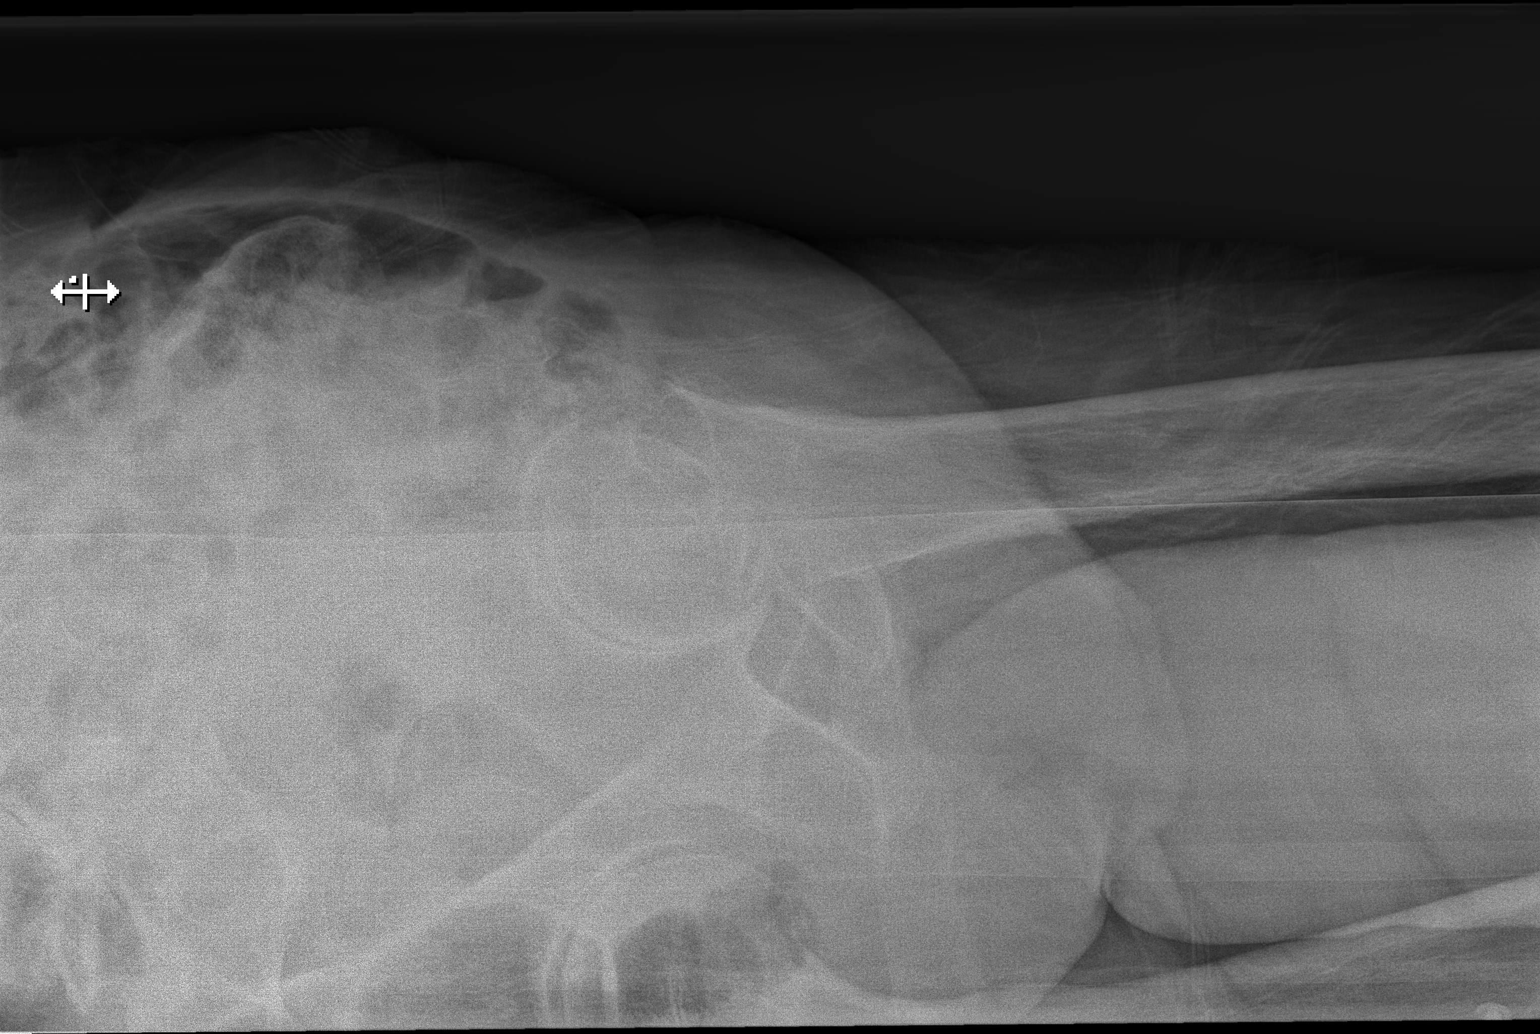

[x pelvis]
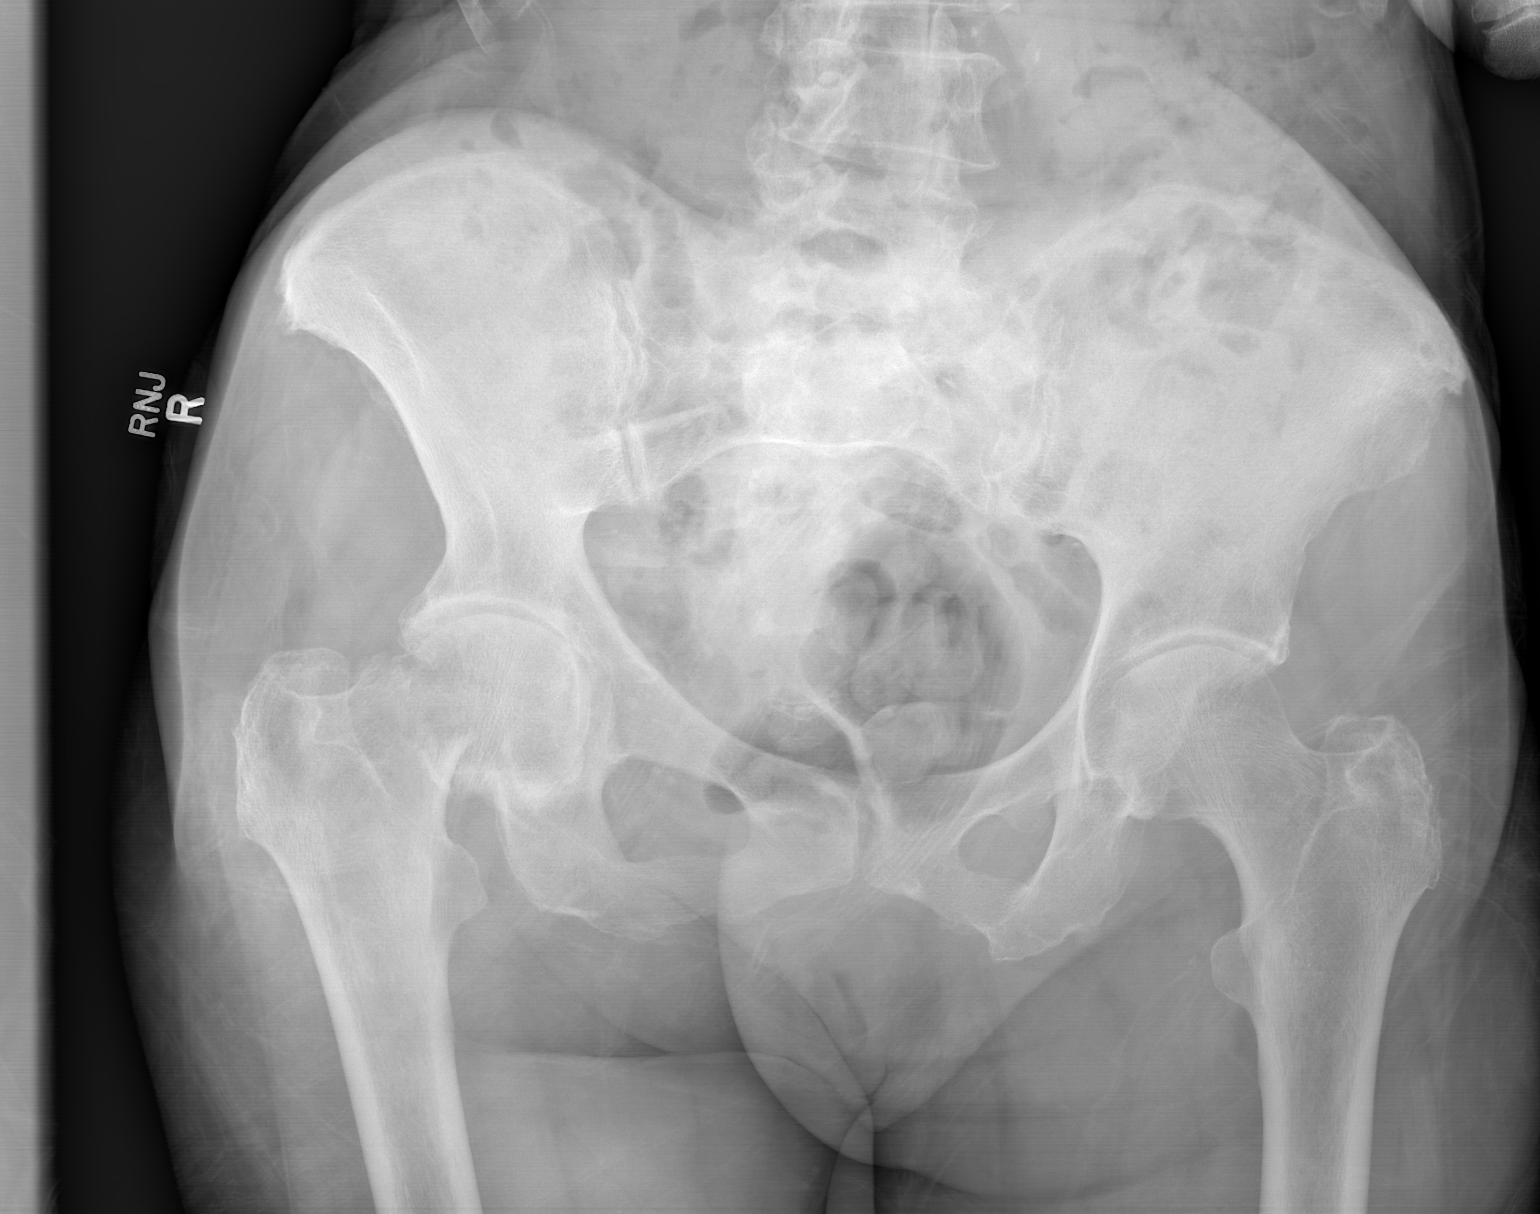

[x hip ap right]
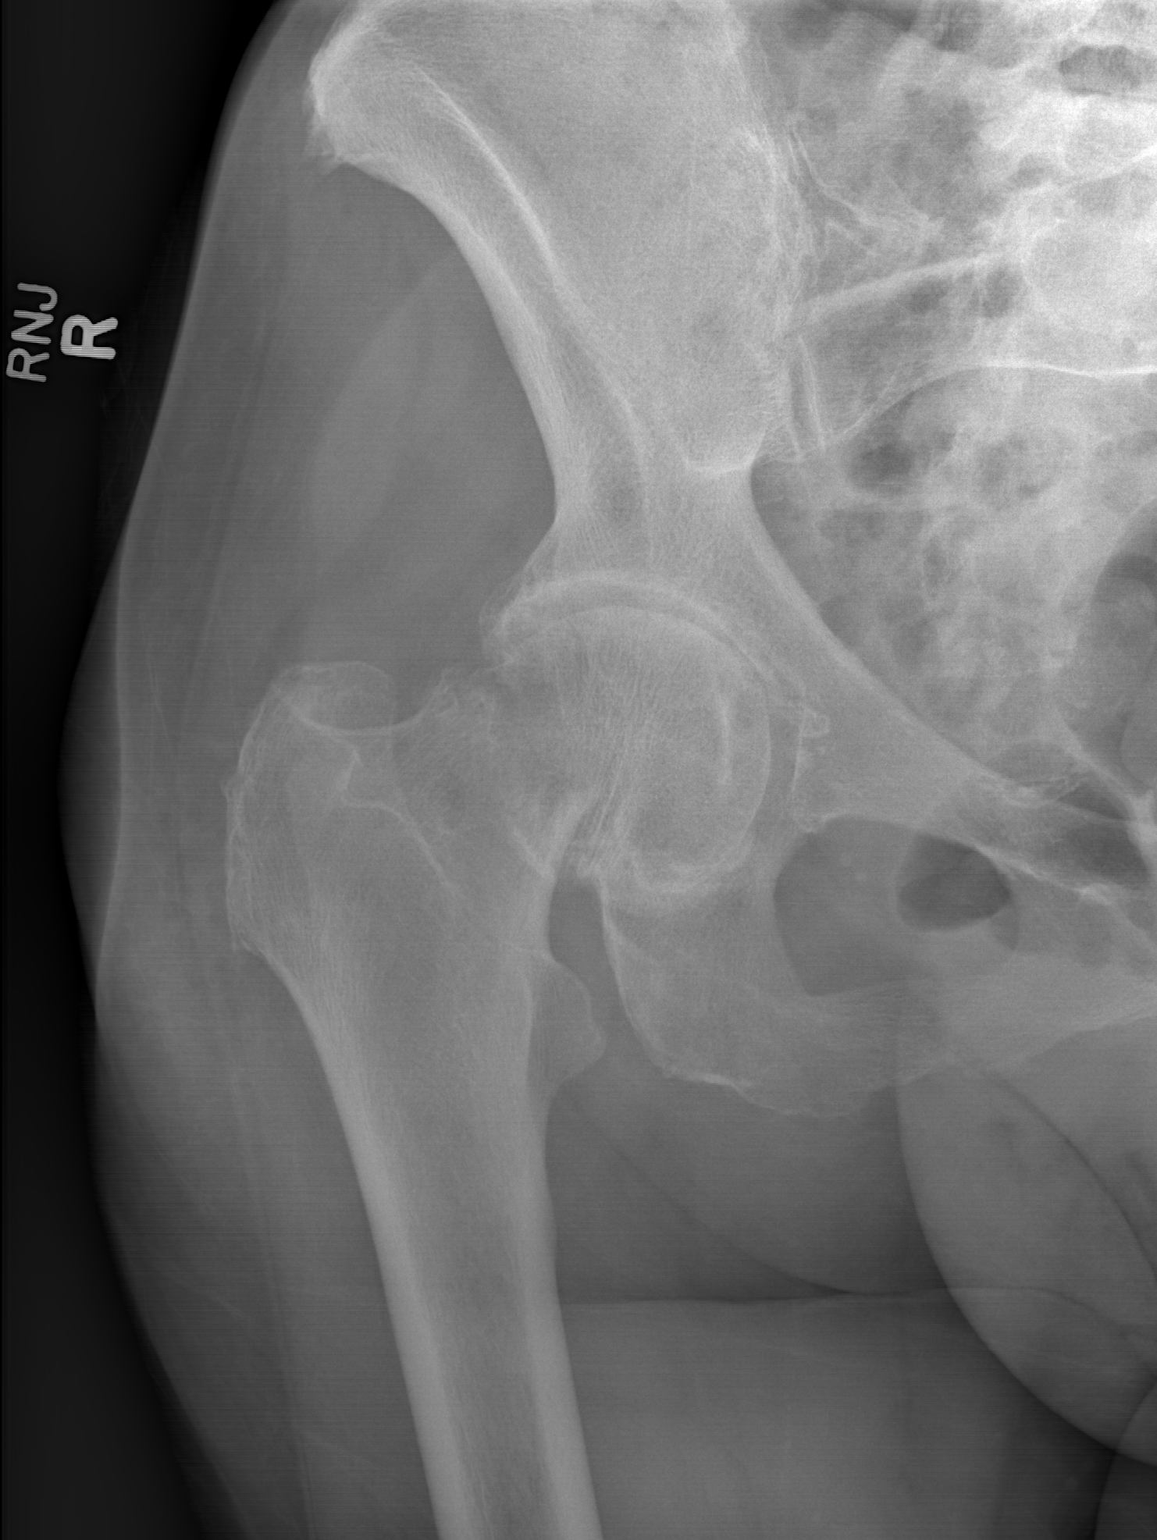

[3 of 3 positions shown; findings below may reference images not displayed]

FINDINGS: There is a slightly comminuted subcapital fracture
through the right femoral neck, with superior displacement of the
distal femur.  The right femoral head remains seated at the
acetabulum.  There is mild axial joint space narrowing, with
associated sclerotic change.

More minimal degenerative change is noted at the left hip.  Mild
sclerosis is noted at the sacroiliac joints.  Mild degenerative
change is noted at the lower lumbar spine.

The visualized bowel gas pattern is grossly unremarkable in
appearance.
IMPRESSION: Slightly comminuted subcapital fracture through the right femoral
neck, with superior displacement of the distal femur.  Mild
degenerative change noted at the right hip joint.

## 2013-01-08 IMAGING — CT CT HEAD W/O CM
1 of 2 series · 15 of 30 positions shown, 19 images · non-contrast
Comparison: CT head without contrast 02/23/2011 at [REDACTED].

CLINICAL DATA: Postoperative confusion.  Status post right hip
surgery.  Visual deficits.

CT HEAD WITHOUT CONTRAST
TECHNIQUE: Contiguous axial images were obtained from the base of
the skull through the vertex without contrast.

[Series 2: head_seq 4.5 h37s st · axial · 0.43mm/px · z∈[-135,+9]mm · 15 of 36 slices shown, 19 images]
[im 2/36  brain]
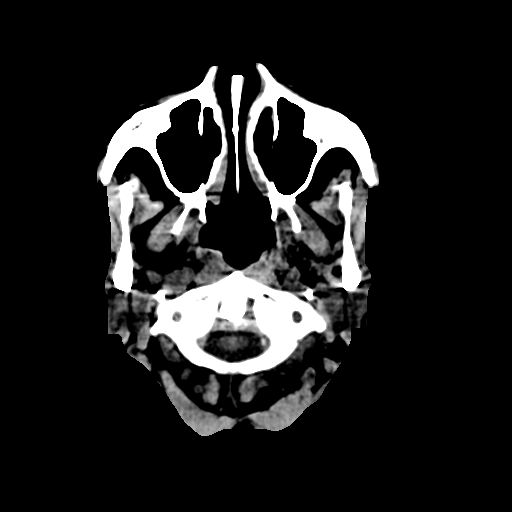
[im 2/36  bone]
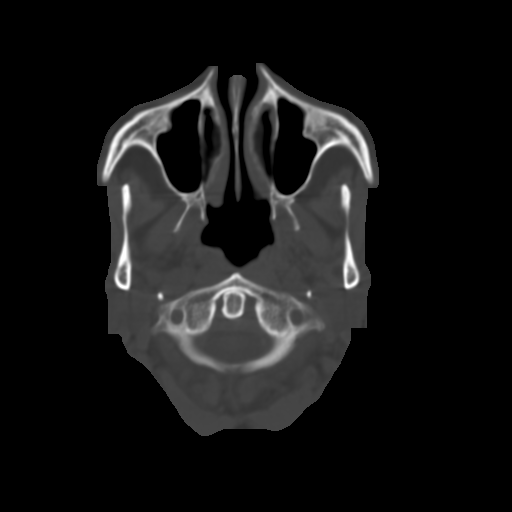
[im 6/36  brain]
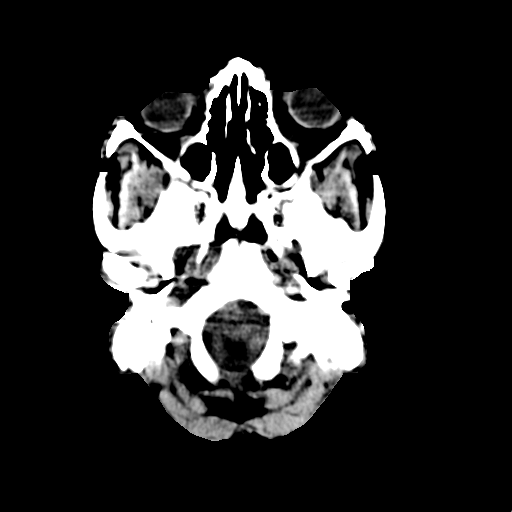
[im 7/36  brain]
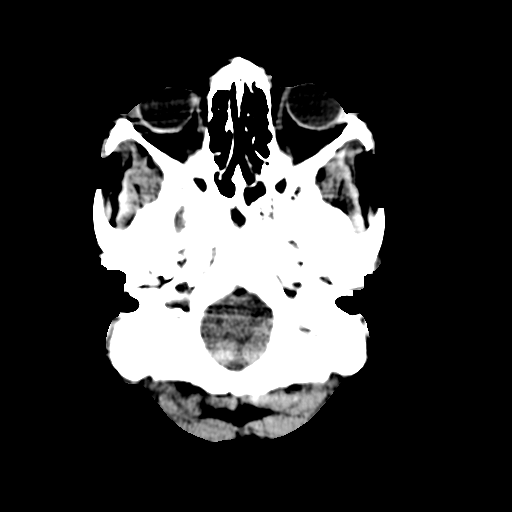
[im 9/36  brain]
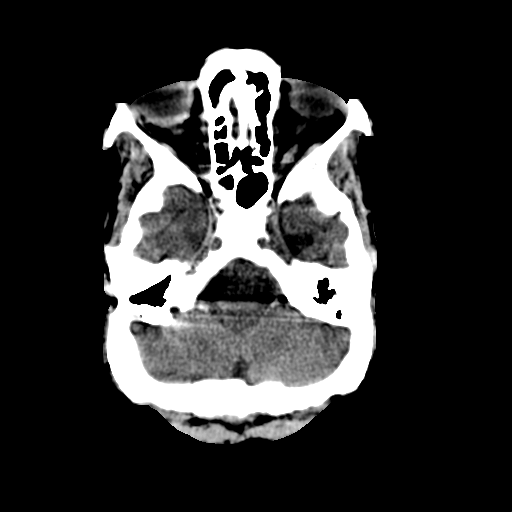
[im 12/36  brain]
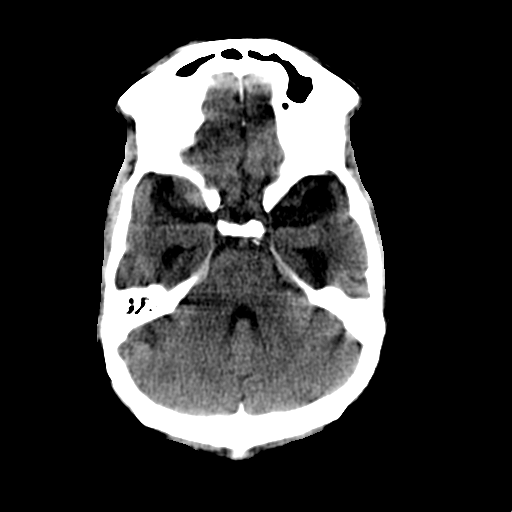
[im 12/36  bone]
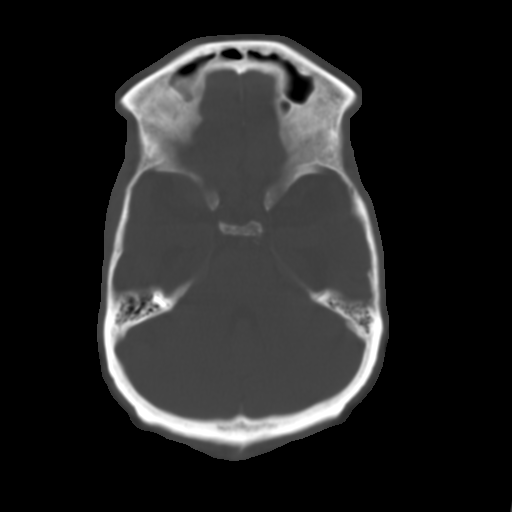
[im 14/36  brain]
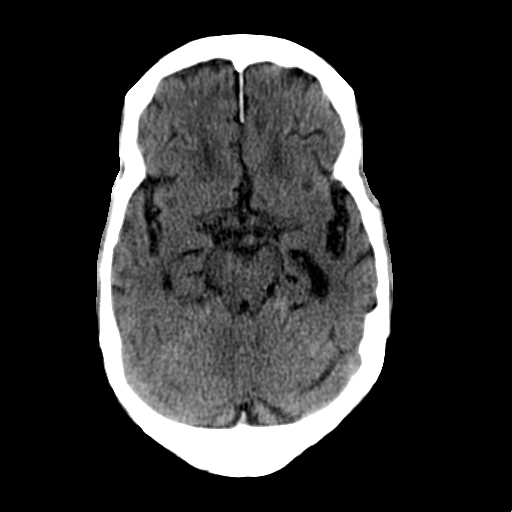
[im 16/36  brain]
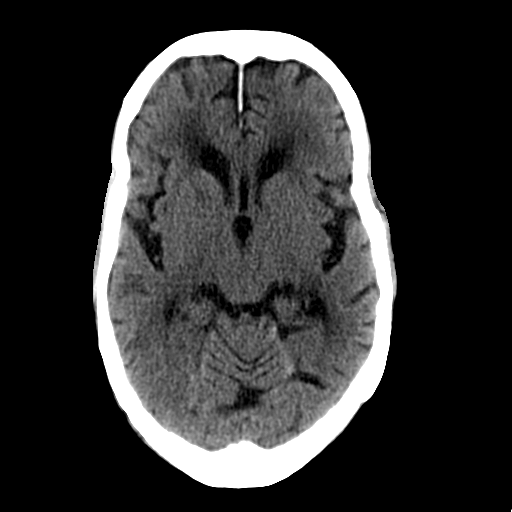
[im 19/36  brain]
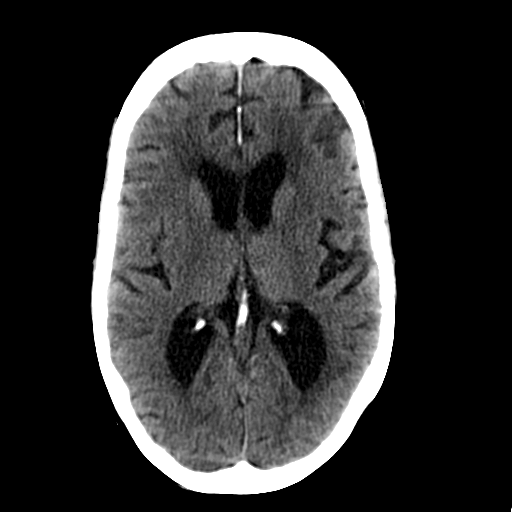
[im 21/36  brain]
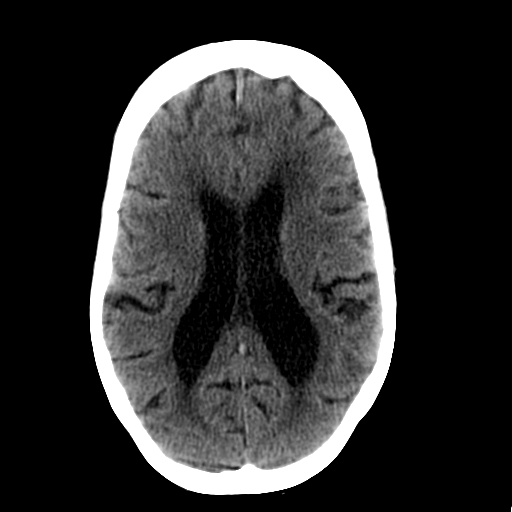
[im 21/36  bone]
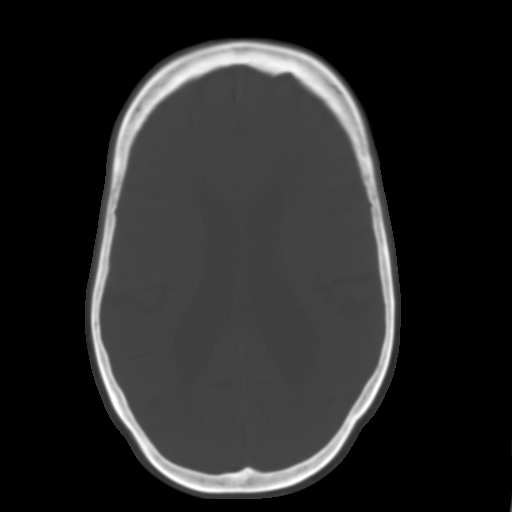
[im 22/36  brain]
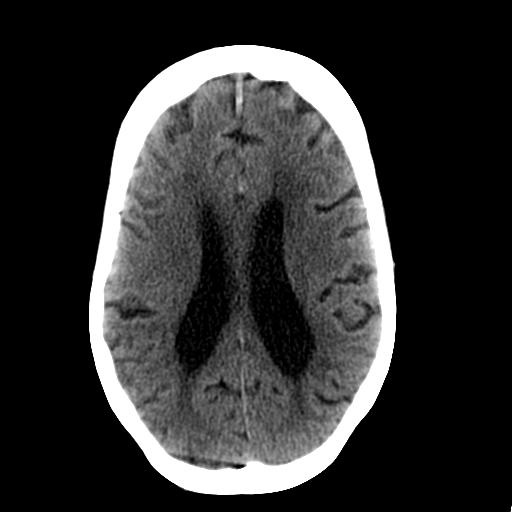
[im 26/36  brain]
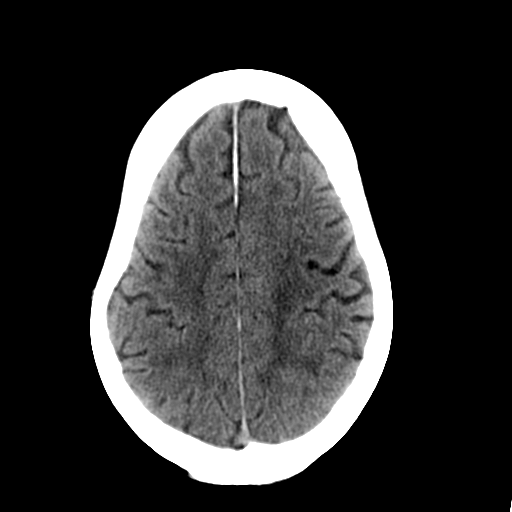
[im 27/36  brain]
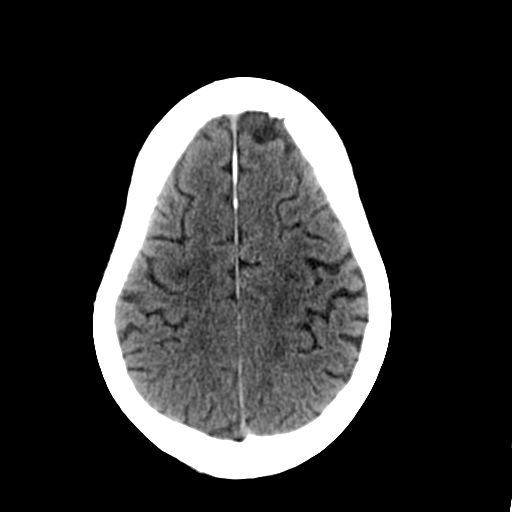
[im 29/36  brain]
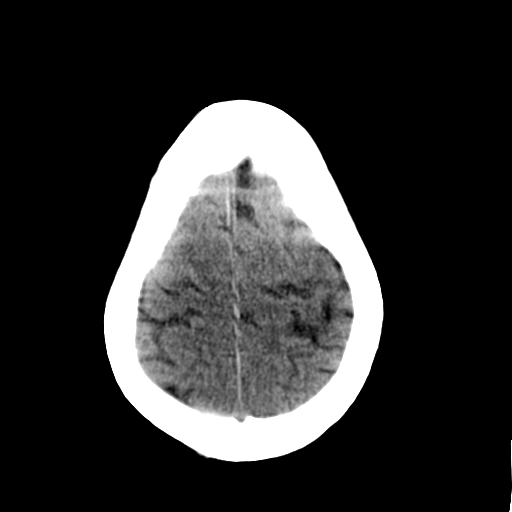
[im 29/36  bone]
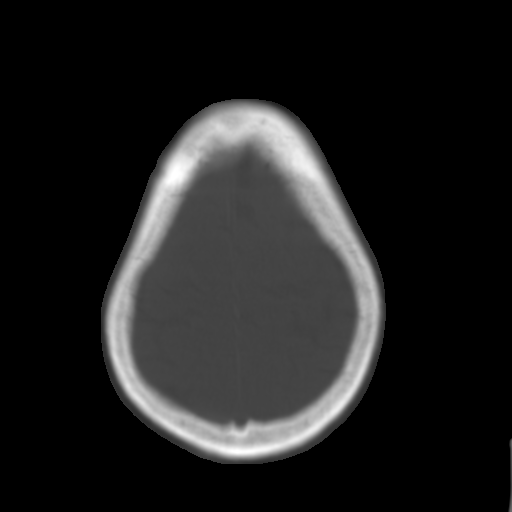
[im 32/36  brain]
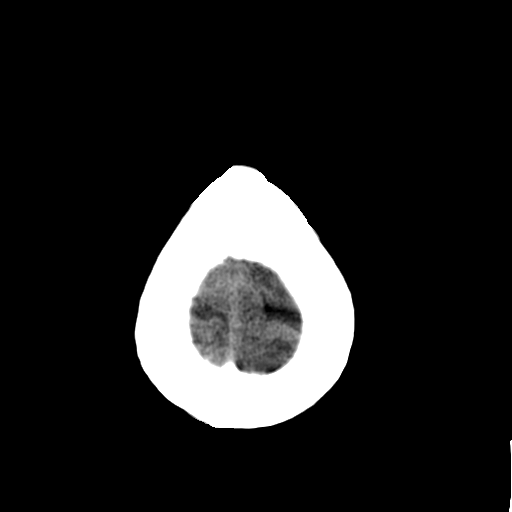
[im 34/36  brain]
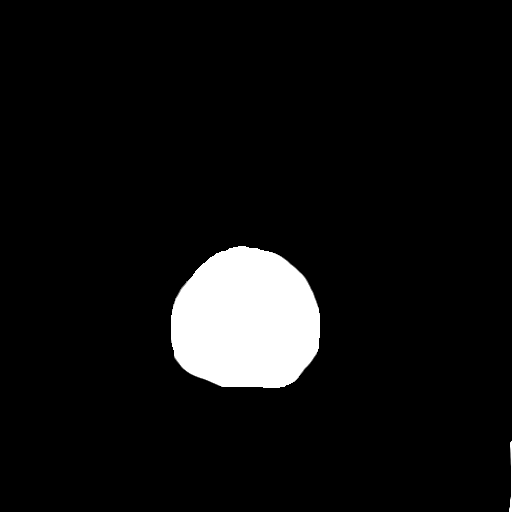

[15 of 30 positions shown; findings below may reference images not displayed]

FINDINGS: No acute cortical infarct, hemorrhage, mass lesion is
present.  Moderate periventricular and subcortical confluent white
matter hypoattenuation is present.  Mild generalized atrophy is
stable.  The ventricles are proportionate to the degree of atrophy.
No significant extra-axial fluid collection is present.

The wall thickening about the sphenoid sinuses suggests chronic
disease.  No mucosal thickening or fluid levels are present.  The
paranasal sinuses are otherwise clear.  The mastoid air cells are
clear.  The osseous skull is intact.  Atherosclerotic
calcifications are evident within the cavernous carotid arteries.
IMPRESSION: 1.  No acute intracranial abnormality or significant interval
change.
2.  Stable atrophy and diffuse white matter disease.  This likely
reflects the sequelae of chronic microvascular ischemia.
3.  Atherosclerosis.

## 2013-08-19 ENCOUNTER — Emergency Department (HOSPITAL_COMMUNITY)
Admission: EM | Admit: 2013-08-19 | Discharge: 2013-08-19 | Disposition: A | Discharge status: Home / Self Care | Payer: Medicare Other | Attending: Emergency Medicine | Admitting: Emergency Medicine

## 2013-08-19 ENCOUNTER — Encounter (HOSPITAL_COMMUNITY): Payer: Self-pay | Admitting: Emergency Medicine

## 2013-08-19 DIAGNOSIS — F0390 Unspecified dementia without behavioral disturbance: Secondary | ICD-10-CM | POA: Insufficient documentation

## 2013-08-19 DIAGNOSIS — Z7982 Long term (current) use of aspirin: Secondary | ICD-10-CM | POA: Insufficient documentation

## 2013-08-19 DIAGNOSIS — I10 Essential (primary) hypertension: Secondary | ICD-10-CM | POA: Insufficient documentation

## 2013-08-19 DIAGNOSIS — Z8673 Personal history of transient ischemic attack (TIA), and cerebral infarction without residual deficits: Secondary | ICD-10-CM | POA: Insufficient documentation

## 2013-08-19 DIAGNOSIS — E079 Disorder of thyroid, unspecified: Secondary | ICD-10-CM | POA: Insufficient documentation

## 2013-08-19 DIAGNOSIS — F22 Delusional disorders: Secondary | ICD-10-CM | POA: Insufficient documentation

## 2013-08-19 DIAGNOSIS — R443 Hallucinations, unspecified: Secondary | ICD-10-CM | POA: Insufficient documentation

## 2013-08-19 DIAGNOSIS — K5900 Constipation, unspecified: Secondary | ICD-10-CM | POA: Insufficient documentation

## 2013-08-19 DIAGNOSIS — Z862 Personal history of diseases of the blood and blood-forming organs and certain disorders involving the immune mechanism: Secondary | ICD-10-CM | POA: Insufficient documentation

## 2013-08-19 DIAGNOSIS — E7800 Pure hypercholesterolemia, unspecified: Secondary | ICD-10-CM | POA: Insufficient documentation

## 2013-08-19 DIAGNOSIS — M1990 Unspecified osteoarthritis, unspecified site: Secondary | ICD-10-CM | POA: Insufficient documentation

## 2013-08-19 DIAGNOSIS — M129 Arthropathy, unspecified: Secondary | ICD-10-CM | POA: Insufficient documentation

## 2013-08-19 DIAGNOSIS — Z79899 Other long term (current) drug therapy: Secondary | ICD-10-CM | POA: Insufficient documentation

## 2013-08-19 LAB — CBC WITH DIFFERENTIAL/PLATELET
Basophils Relative: 0 % (ref 0–1)
Eosinophils Absolute: 0.1 10*3/uL (ref 0.0–0.7)
Eosinophils Relative: 1 % (ref 0–5)
HCT: 35.3 % — ABNORMAL LOW (ref 36.0–46.0)
Hemoglobin: 12 g/dL (ref 12.0–15.0)
Lymphs Abs: 1.9 10*3/uL (ref 0.7–4.0)
MCH: 31.3 pg (ref 26.0–34.0)
MCHC: 34 g/dL (ref 30.0–36.0)
MCV: 92.2 fL (ref 78.0–100.0)
Monocytes Absolute: 0.5 10*3/uL (ref 0.1–1.0)
Neutro Abs: 4.3 10*3/uL (ref 1.7–7.7)
Neutrophils Relative %: 63 % (ref 43–77)
RBC: 3.83 MIL/uL — ABNORMAL LOW (ref 3.87–5.11)

## 2013-08-19 LAB — URINALYSIS, ROUTINE W REFLEX MICROSCOPIC
Bilirubin Urine: NEGATIVE
Glucose, UA: NEGATIVE mg/dL
Hgb urine dipstick: NEGATIVE
Ketones, ur: NEGATIVE mg/dL
Nitrite: NEGATIVE
Specific Gravity, Urine: 1.014 (ref 1.005–1.030)
pH: 7.5 (ref 5.0–8.0)

## 2013-08-19 LAB — COMPREHENSIVE METABOLIC PANEL
AST: 18 U/L (ref 0–37)
CO2: 26 mEq/L (ref 19–32)
Calcium: 9.5 mg/dL (ref 8.4–10.5)
Chloride: 98 mEq/L (ref 96–112)
Creatinine, Ser: 0.84 mg/dL (ref 0.50–1.10)
GFR calc Af Amer: 68 mL/min — ABNORMAL LOW (ref >90)
GFR calc non Af Amer: 59 mL/min — ABNORMAL LOW (ref >90)
Glucose, Bld: 113 mg/dL — ABNORMAL HIGH (ref 70–99)
Total Bilirubin: 0.2 mg/dL — ABNORMAL LOW (ref 0.3–1.2)

## 2013-08-19 LAB — URINE MICROSCOPIC-ADD ON

## 2013-08-19 NOTE — Consult Note (Signed)
SW is seeing pt and will do the assessment but it makes sense for SW to be involved at the onset since SW assistance will most likely be needed to pt needs
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
## 2013-08-19 NOTE — ED Notes (Signed)
Pt has dementia and needs assistance with walking unable to go to psych ED.
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
## 2013-08-19 NOTE — Progress Notes (Signed)
CSW  Provided family with outpatient psychiatry and counseling referral listings.  CSW consulted with [facility] concerning referrals for family concerning day centers or resources that might be helpful for family.  Marva Panda, Theresia Majors  [phone]  .08/19/2013

## 2013-08-20 NOTE — Progress Notes (Signed)
   CARE MANAGEMENT ED NOTE 08/20/2013  Patient:  Summer [name], Summer [name]   Account Number:  [id number]  Date Initiated:  08/20/2013  Documentation initiated by:  Edd Arbour  Subjective/Objective Assessment:   79 yr old [employer] medicare complete pt with [facility] visit on 08/19/13 evening for hx dementia, having hallucinations, not taking meds Medically cleared by EDP Seen by ED SW Referred by ED SW to call to assist daughter in possible resources     Subjective/Objective Assessment Detail:   Dtr brought pt in [facility] ED for evaluations pcp listed as ravisankar r avva  ED Sw provided outpatient resources/adult day care information  Pt was reviewed by [facility] extender who recommended discharging pt home to have pt follow up with her PCP for referrals for a full neuropsych consult/evaluation and a psychiatrist who could help pt manage her medications dx Dementia  Per ED SW Daughter does not want facility placement No home health service noted in EPIC     Action/Plan:   Cm Reviewed EPIC notes from 2013 & 08/19/13 Cm called [phone] and left a voice message requesting a return call from pt and/or daughter Christieen Burnsworth   Action/Plan Detail:   Further CM interventions pending return call from pt and/or daughter   Anticipated DC Date:  08/19/2013     Status Recommendation to Physician:   Result of Recommendation:    Other ED Services  Consult Working Plan   In-house referral  Clinical Social Worker   DC Associate Professor  Other  Outpatient Services - Pt will follow up    Choice offered to / List presented to:            Status of service:  Completed, signed off  ED Comments:   ED Comments Detail:

## 2013-10-19 ENCOUNTER — Encounter (HOSPITAL_COMMUNITY): Payer: Self-pay | Admitting: Emergency Medicine

## 2013-10-19 ENCOUNTER — Emergency Department (HOSPITAL_COMMUNITY)
Admission: EM | Admit: 2013-10-19 | Discharge: 2013-10-19 | Disposition: A | Discharge status: Home / Self Care | Payer: Medicare Other | Attending: Emergency Medicine | Admitting: Emergency Medicine

## 2013-10-19 DIAGNOSIS — E7800 Pure hypercholesterolemia, unspecified: Secondary | ICD-10-CM | POA: Insufficient documentation

## 2013-10-19 DIAGNOSIS — I10 Essential (primary) hypertension: Secondary | ICD-10-CM | POA: Insufficient documentation

## 2013-10-19 DIAGNOSIS — F22 Delusional disorders: Secondary | ICD-10-CM | POA: Insufficient documentation

## 2013-10-19 DIAGNOSIS — Z79899 Other long term (current) drug therapy: Secondary | ICD-10-CM | POA: Insufficient documentation

## 2013-10-19 DIAGNOSIS — R4689 Other symptoms and signs involving appearance and behavior: Secondary | ICD-10-CM

## 2013-10-19 DIAGNOSIS — Z7982 Long term (current) use of aspirin: Secondary | ICD-10-CM | POA: Insufficient documentation

## 2013-10-19 DIAGNOSIS — Z8719 Personal history of other diseases of the digestive system: Secondary | ICD-10-CM | POA: Insufficient documentation

## 2013-10-19 DIAGNOSIS — E079 Disorder of thyroid, unspecified: Secondary | ICD-10-CM | POA: Insufficient documentation

## 2013-10-19 DIAGNOSIS — F911 Conduct disorder, childhood-onset type: Secondary | ICD-10-CM | POA: Insufficient documentation

## 2013-10-19 DIAGNOSIS — N390 Urinary tract infection, site not specified: Secondary | ICD-10-CM | POA: Insufficient documentation

## 2013-10-19 DIAGNOSIS — Z862 Personal history of diseases of the blood and blood-forming organs and certain disorders involving the immune mechanism: Secondary | ICD-10-CM | POA: Insufficient documentation

## 2013-10-19 DIAGNOSIS — Z8673 Personal history of transient ischemic attack (TIA), and cerebral infarction without residual deficits: Secondary | ICD-10-CM | POA: Insufficient documentation

## 2013-10-19 DIAGNOSIS — M1990 Unspecified osteoarthritis, unspecified site: Secondary | ICD-10-CM | POA: Insufficient documentation

## 2013-10-19 DIAGNOSIS — F0390 Unspecified dementia without behavioral disturbance: Secondary | ICD-10-CM | POA: Insufficient documentation

## 2013-10-19 LAB — CBC WITH DIFFERENTIAL/PLATELET
Basophils Absolute: 0 10*3/uL (ref 0.0–0.1)
Basophils Relative: 0 % (ref 0–1)
Eosinophils Relative: 1 % (ref 0–5)
HCT: 36.4 % (ref 36.0–46.0)
MCHC: 34.1 g/dL (ref 30.0–36.0)
MCV: 93.3 fL (ref 78.0–100.0)
Monocytes Absolute: 1 10*3/uL (ref 0.1–1.0)
Platelets: 168 10*3/uL (ref 150–400)
RDW: 14 % (ref 11.5–15.5)
WBC: 6.6 10*3/uL (ref 4.0–10.5)

## 2013-10-19 LAB — URINALYSIS, ROUTINE W REFLEX MICROSCOPIC
Glucose, UA: NEGATIVE mg/dL
Hgb urine dipstick: NEGATIVE
Specific Gravity, Urine: 1.025 (ref 1.005–1.030)
Urobilinogen, UA: 1 mg/dL (ref 0.0–1.0)
pH: 6.5 (ref 5.0–8.0)

## 2013-10-19 LAB — COMPREHENSIVE METABOLIC PANEL
ALT: 7 U/L (ref 0–35)
AST: 15 U/L (ref 0–37)
Albumin: 3.4 g/dL — ABNORMAL LOW (ref 3.5–5.2)
Calcium: 8.9 mg/dL (ref 8.4–10.5)
Creatinine, Ser: 0.87 mg/dL (ref 0.50–1.10)
Sodium: 135 mEq/L (ref 135–145)
Total Protein: 7.7 g/dL (ref 6.0–8.3)

## 2013-10-19 LAB — VALPROIC ACID LEVEL: Valproic Acid Lvl: 35.8 ug/mL — ABNORMAL LOW (ref 50.0–100.0)

## 2013-10-19 LAB — URINE MICROSCOPIC-ADD ON

## 2013-10-19 LAB — ETHANOL: Alcohol, Ethyl (B): <11 mg/dL (ref 0–11)

## 2013-10-19 LAB — RAPID URINE DRUG SCREEN, HOSP PERFORMED
Barbiturates: NOT DETECTED
Benzodiazepines: POSITIVE — AB

## 2013-10-19 MED ORDER — AMLODIPINE BESYLATE 5 MG PO TABS
5.0000 mg | ORAL_TABLET | Freq: Every day | ORAL | Status: DC
  Administered 2013-10-19: 5 mg via ORAL
  Filled 2013-10-19: qty 1

## 2013-10-19 MED ORDER — PANTOPRAZOLE SODIUM 40 MG PO TBEC: 40.0000 mg | DELAYED_RELEASE_TABLET | Freq: Every day | ORAL | Status: DC

## 2013-10-19 MED ORDER — ASPIRIN 325 MG PO TABS: 325.0000 mg | ORAL_TABLET | Freq: Every day | ORAL | Status: DC

## 2013-10-19 MED ORDER — NICOTINE 21 MG/24HR TD PT24: 21.0000 mg | MEDICATED_PATCH | Freq: Every day | TRANSDERMAL | Status: DC

## 2013-10-19 MED ORDER — ALUM & MAG HYDROXIDE-SIMETH 200-200-20 MG/5ML PO SUSP: 30.0000 mL | ORAL | Status: DC | PRN

## 2013-10-19 MED ORDER — SIMVASTATIN 20 MG PO TABS: 20.0000 mg | ORAL_TABLET | Freq: Every day | ORAL | Status: DC

## 2013-10-19 MED ORDER — SULFAMETHOXAZOLE-TMP DS 800-160 MG PO TABS: 1.0000 | ORAL_TABLET | Freq: Two times a day (BID) | ORAL | Status: DC

## 2013-10-19 MED ORDER — SULFAMETHOXAZOLE-TMP DS 800-160 MG PO TABS: 1.0000 | ORAL_TABLET | Freq: Two times a day (BID) | ORAL | Status: AC

## 2013-10-19 MED ORDER — SIMVASTATIN 20 MG PO TABS
20.0000 mg | ORAL_TABLET | Freq: Every day | ORAL | Status: DC
  Filled 2013-10-19: qty 1

## 2013-10-19 MED ORDER — RIVASTIGMINE 4.6 MG/24HR TD PT24: 4.6000 mg | MEDICATED_PATCH | Freq: Every day | TRANSDERMAL | Status: DC

## 2013-10-19 MED ORDER — DIVALPROEX SODIUM 500 MG PO DR TAB
500.0000 mg | DELAYED_RELEASE_TABLET | Freq: Once | ORAL | Status: DC
  Filled 2013-10-19: qty 1

## 2013-10-19 MED ORDER — IRBESARTAN 300 MG PO TABS
300.0000 mg | ORAL_TABLET | Freq: Every day | ORAL | Status: DC
  Administered 2013-10-19: 300 mg via ORAL
  Filled 2013-10-19: qty 1

## 2013-10-19 MED ORDER — ALPRAZOLAM 0.25 MG PO TABS: 0.2500 mg | ORAL_TABLET | Freq: Every evening | ORAL | Status: DC | PRN

## 2013-10-19 MED ORDER — ACETAMINOPHEN 325 MG PO TABS: 650.0000 mg | ORAL_TABLET | ORAL | Status: DC | PRN

## 2013-10-19 MED ORDER — ONDANSETRON HCL 4 MG PO TABS: 4.0000 mg | ORAL_TABLET | Freq: Three times a day (TID) | ORAL | Status: DC | PRN

## 2013-10-19 MED ORDER — RISPERIDONE 0.5 MG PO TABS: 0.2500 mg | ORAL_TABLET | Freq: Every day | ORAL | Status: DC

## 2013-10-19 MED ORDER — VENLAFAXINE HCL ER 37.5 MG PO CP24: 37.5000 mg | ORAL_CAPSULE | Freq: Every day | ORAL | Status: DC

## 2013-10-19 MED ORDER — LEVOTHYROXINE SODIUM 88 MCG PO TABS
88.0000 ug | ORAL_TABLET | Freq: Every day | ORAL | Status: DC
  Filled 2013-10-19: qty 1

## 2013-10-19 MED ORDER — IBUPROFEN 200 MG PO TABS: 600.0000 mg | ORAL_TABLET | Freq: Three times a day (TID) | ORAL | Status: DC | PRN

## 2013-10-19 MED ORDER — POLYETHYLENE GLYCOL 3350 17 G PO PACK
17.0000 g | PACK | Freq: Every day | ORAL | Status: DC
  Administered 2013-10-19: 17 g via ORAL
  Filled 2013-10-19: qty 1

## 2013-10-19 NOTE — ED Notes (Signed)
Bed: ZO10 Expected date:  Expected time:  Means of arrival:  Comments: Hold for triage 79 y/o med clearance
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
## 2013-10-19 NOTE — Progress Notes (Signed)
[facility] faxed home health orders to [facility] at 1900pm with confirmation of receipt at 1901pm

## 2013-10-19 NOTE — ED Provider Notes (Signed)
TIME SEEN: 2:35 PM  CHIEF COMPLAINT: Hallucinations, paranoia, threatening behavior  HPI: Patient is a 79 y.o. female with a history of hypertension, hyperlipidemia, hypothyroidism Alzheimer's dementia, bipolar with paranoid behavior who presents the emergency department with several days of paranoia, hallucinations and threatening her daughter who she lives with. Reports that the patient is refusing to take her medications. She was recently started on Depakote and risperidone by [facility], Dr. Madaline Guthrie. She reports that these medications have not been helping she feels her mother is getting progressively worse. They deny that the she has had any head injury, focal neurologic deficits, fever, cough, vomiting or diarrhea. Daughter has video confirming patient's paranoia and aggressive behavior.  ROS: Level V caveat for dementia  PAST MEDICAL HISTORY/PAST SURGICAL HISTORY:  Past Medical History  Diagnosis Date  . Hypertension   . Arthritis   . High cholesterol   . Thyroid disease   . Constipation   . Hx-TIA (transient ischemic attack)   . Dementia   . Night sweats 08/12/2012    having them currently per daughter  . Anemia   . Osteoarthritis     MEDICATIONS:  Prior to Admission medications   Medication Sig Start Date End Date Taking? Authorizing Provider  ALPRAZolam (XANAX) 0.25 MG tablet Take 0.25 mg by mouth at bedtime as needed.    Historical Provider, MD  amLODipine (NORVASC) 5 MG tablet Take 5 mg by mouth daily.    Historical Provider, MD  aspirin 325 MG tablet Take 325 mg by mouth daily.    Historical Provider, MD  HYDROcodone-acetaminophen (NORCO/VICODIN) 5-325 MG per tablet Take 1 tablet by mouth every 6 (six) hours as needed. PAIN    Historical Provider, MD  levothyroxine (SYNTHROID, LEVOTHROID) 88 MCG tablet Take 88 mcg by mouth daily.      Historical Provider, MD  menthol-zinc oxide (GOLD BOND) powder Apply 2 application topically daily.    Historical  Provider, MD  Multiple Vitamin (MULTIVITAMIN) capsule Take 1 capsule by mouth daily.      Historical Provider, MD  Nutritional Supplements (ESTROVEN PO) Take 1 tablet by mouth at bedtime.    Historical Provider, MD  omeprazole (PRILOSEC) 20 MG capsule Take 20 mg by mouth 2 (two) times daily.     Historical Provider, MD  polyethylene glycol (MIRALAX / GLYCOLAX) packet Take 17 g by mouth as needed. Constipation    Historical Provider, MD  QUEtiapine (SEROQUEL) 25 MG tablet Take 2 tablets (50 mg total) by mouth 2 (two) times daily. 09/06/12   Gerhard Munch, MD  rivastigmine (EXELON) 4.6 mg/24hr Place 1 patch onto the skin daily.    Historical Provider, MD  simvastatin (ZOCOR) 20 MG tablet Take 20 mg by mouth at bedtime.      Historical Provider, MD  valsartan (DIOVAN) 320 MG tablet Take 320 mg by mouth daily.     Historical Provider, MD  venlafaxine XR (EFFEXOR-XR) 37.5 MG 24 hr capsule Take 37.5 mg by mouth daily.    Historical Provider, MD    ALLERGIES:  No Known Allergies  SOCIAL HISTORY:  History  Substance Use Topics  . Smoking status: Never Smoker   . Smokeless tobacco: Never Used  . Alcohol Use: No    FAMILY HISTORY: History reviewed. No pertinent family history.  EXAM: BP 175/70  Pulse 72  Temp(Src) 98.1 F (36.7 C) (Oral)  Resp 18  SpO2 99% CONSTITUTIONAL: Alert and oriented to person and place only, disoriented to time and responds appropriately to questions.  Well-appearing; well-nourished, well-hydrated and nontoxic appearing, pleasant HEAD: Normocephalic EYES: Conjunctivae clear, PERRL ENT: normal nose; no rhinorrhea; moist mucous membranes; pharynx without lesions noted NECK: Supple, no meningismus, no LAD  CARD: RRR; S1 and S2 appreciated; no murmurs, no clicks, no rubs, no gallops RESP: Normal chest excursion without splinting or tachypnea; breath sounds clear and equal bilaterally; no wheezes, no rhonchi, no rales,  ABD/GI: Normal bowel sounds; non-distended;  soft, non-tender, no rebound, no guarding BACK:  The back appears normal and is non-tender to palpation, there is no CVA tenderness EXT: Normal ROM in all joints; non-tender to palpation; no edema; normal capillary refill; no cyanosis    SKIN: Normal color for age and race; warm NEURO: Moves all extremities equally, no facial droop or slurred speech PSYCH: Patient denies homicidal or suicidal ideation. She does report that there is a man that she feels her daughter is giving money to you and that they are stealing from her. She does report that she thought her daughter was trying to poison her this morning. She denies any hallucinations. Grooming and personal hygiene are appropriate.  MEDICAL DECISION MAKING: Patient here with paranoia and aggressive behavior. Patient is not currently IVC'ed. Daughter is concerned that her Depakote and Risperdal and have not been helping. Will obtain labs, urine and discuss with behavioral health for evaluation. Daughter reports she would be interested in temporary placement for medication management and symptom control but does not want long-term placement. Daughter also confirms that the patient is a full code.  ED PROGRESS: Patient's labs are reassuring. Her urine does show small leukocytes but no other sign of infection. Urine culture pending. Will place on Bactrim twice daily for 1 week for possible UTI. Appropriate level is slightly low. Patient is medically cleared and a waiting TTS consult.   4:37 PM  Pt assessed by [facility] counselor.  Patient's daughter thought patient would be admitted to the hospital here for management and does not want her placed in a another outpatient geriatric/psychiatric facility. She states she feels comfortable taking her home and would prefer this over placement at this time. We'll have her followup with their psychiatrist for medication management. Given return precautions. We'll discharge him with antibiotics for her UTI. Patient's  daughter is her primary caregiver and states she feels safe with the patient being in her home.  Layla Maw Ward, DO 10/19/13 [phone]

## 2013-10-19 NOTE — Progress Notes (Signed)
   CARE MANAGEMENT ED NOTE 10/19/2013  Patient:  Summer [name], Summer [name]   Account Number:  [id number]  Date Initiated:  10/19/2013  Documentation initiated by:  Radford Pax  Subjective/Objective Assessment:   Patient presents to Ed with several days of paranoia, hallucinations, and threatening to hurt her daughter.     Subjective/Objective Assessment Detail:   Patient with pmh of dementia, and bipolar.     Action/Plan:   Action/Plan Detail:   Anticipated DC Date:       Status Recommendation to Physician:   Result of Recommendation:    Other ED Services  Consult Working Plan    DC [employer]  Other    Choice offered to / List presented to:  C-4 Adult Children     HH arranged  HH-1 RN  HH-10 DISEASE MANAGEMENT      HH agency  [facility]    Status of service:  Completed, signed off  ED Comments:   ED Comments Detail:  EDCM spoke to patient's daughter Summer [name] at bedside. Patient to be discharged home.  Patient's daughter reports she is with the patient 24/7 except on Thursdays when she has an aide come to stay with her while patient's daughter goes out and "does her shopping."  Patient's daughter reports patient has a walker, cane and a wheelchair at home.  EDCM asked patient's daughter if she would be interested in receiving a visiting RN for disease management and medication review.  EDCM provided list of home health agencies in [facility] of which patient's daughter was agreeable to [facility].  Patient's daughter Summer [name] phone number [phone].  EDCM will fax home health orders to [facility].  Patient's daughter thankful for services.  No further CM needs at this time.  Discussed patient with EDP.

## 2013-10-20 LAB — URINE CULTURE: Colony Count: 40000

## 2014-07-01 ENCOUNTER — Ambulatory Visit (INDEPENDENT_AMBULATORY_CARE_PROVIDER_SITE_OTHER): Payer: Medicare Other | Admitting: Podiatry

## 2014-07-01 ENCOUNTER — Encounter: Payer: Self-pay | Admitting: Podiatry

## 2014-07-01 VITALS — BP 163/93 | HR 82 | Resp 14 | Ht 67.0 in | Wt 150.0 lb

## 2014-07-01 DIAGNOSIS — M79609 Pain in unspecified limb: Secondary | ICD-10-CM

## 2014-07-01 DIAGNOSIS — B351 Tinea unguium: Secondary | ICD-10-CM

## 2014-07-01 DIAGNOSIS — M79676 Pain in unspecified toe(s): Secondary | ICD-10-CM

## 2014-07-01 NOTE — Patient Instructions (Signed)
Fungi-nail for over the counter treatment of nail fungus.   Keep heels offloaded at all times. Apply moisturizer to the feet. If they crack, you can apply antibiotic ointment and a dressings. If you notice a wound, or worsening of the of heels call the office immediately.

## 2014-07-01 NOTE — Progress Notes (Signed)
   Subjective:    Patient ID: Summer [name], female    DOB: [birth year], 80 y.o.   MRN: [id number]  HPI Comments: Pt's dtr, Summer [name] states pt's heels have been painful to touch for at least 1 year.  Pt's heels are dry, cracked with bruising apparent.  Pt's dtr request pt's toenails to be cut due to the thickness, cracking and pain.   - 80 year old female presents the office today with her daughter, who is her power of attorney, or complaints of elongated thickened painful nails for which she is unable to cut herself without splitting the nail. Also states that she has had pain in her heels. Patient does not ambulate much. No recent infections or systemic complaints. Denies any recent injuries. No other complaints at this time.  Review of Systems  All other systems reviewed and are negative.      Objective:   Physical Exam Awake, alert, NAD DP pulses palpable b/l, PT pulses decreased Protective sensation decreased with Simms Weinstein monofilament bilaterally. Nails hypertrophic, dystrophic, elongated, brittle, discolored x10. The nails are starting to curve under the toe pain pressure on the skin forming pre-ulcerative lesions. Heels are soft, hyperpigmented. There is no open ulceration or eschar formation. Patient does not seem to have discomfort upon palpation of this area at this time. No open lesions MMT decreased.  No leg swelling/pain/warmth       Assessment & Plan:  80 year old female with symptomatic onychomycosis, pre-ulcerative lesions bilateral heels likely secondary to pressure. -Treatment options discussed including alternatives, risks, complications. -Nails sharply debrided x10 without complications. -Recommended strict offloading of the bilateral heels as the patient is not very ambulatory at this time. Recommend either foam offloading boots for which a prescription was given for the foot or heels with a pillow. -Patient is daughter was inquiring  about what they can do for the toenail fungus. At this time I don't recommend any aggressive therapy for that nails the did recommend over-the-counter fungi-nail if they wish to pursue therapy. -Followup in 3 months or sooner if any problems are to arise. Call with any questions or concerns or any change in symptoms.

## 2014-09-29 ENCOUNTER — Ambulatory Visit: Payer: Medicare Other | Admitting: Podiatry

## 2014-10-27 ENCOUNTER — Ambulatory Visit: Payer: Medicare Other | Admitting: Podiatry

## 2014-11-22 DEATH — deceased
